# Patient Record
Sex: Male | Born: 1940
Health system: Southern US, Community
[De-identification: ages and names within clinical notes are randomized; demographics above are authoritative.]

## PROBLEM LIST (undated history)

## (undated) DIAGNOSIS — R29898 Other symptoms and signs involving the musculoskeletal system: Secondary | ICD-10-CM

## (undated) DIAGNOSIS — G459 Transient cerebral ischemic attack, unspecified: Secondary | ICD-10-CM

## (undated) DIAGNOSIS — G709 Myoneural disorder, unspecified: Secondary | ICD-10-CM

## (undated) DIAGNOSIS — I714 Abdominal aortic aneurysm, without rupture, unspecified: Secondary | ICD-10-CM

## (undated) DIAGNOSIS — I219 Acute myocardial infarction, unspecified: Secondary | ICD-10-CM

## (undated) DIAGNOSIS — E785 Hyperlipidemia, unspecified: Secondary | ICD-10-CM

## (undated) DIAGNOSIS — I10 Essential (primary) hypertension: Secondary | ICD-10-CM

## (undated) HISTORY — DX: Abdominal aortic aneurysm, without rupture: I71.4

## (undated) HISTORY — DX: Essential (primary) hypertension: I10

## (undated) HISTORY — DX: Abdominal aortic aneurysm, without rupture, unspecified: I71.40

## (undated) HISTORY — DX: Myoneural disorder, unspecified: G70.9

## (undated) HISTORY — DX: Hyperlipidemia, unspecified: E78.5

## (undated) HISTORY — DX: Acute myocardial infarction, unspecified: I21.9

## (undated) HISTORY — DX: Other symptoms and signs involving the musculoskeletal system: R29.898

## (undated) HISTORY — DX: Transient cerebral ischemic attack, unspecified: G45.9

---

## 2005-11-24 ENCOUNTER — Encounter: Admission: RE | Admit: 2005-11-24 | Discharge: 2005-11-24 | Payer: Self-pay | Admitting: Family Medicine

## 2006-08-20 ENCOUNTER — Encounter: Admission: RE | Admit: 2006-08-20 | Discharge: 2006-08-20 | Payer: Self-pay | Admitting: Emergency Medicine

## 2010-02-25 ENCOUNTER — Encounter: Admission: RE | Admit: 2010-02-25 | Discharge: 2010-02-25 | Payer: Self-pay | Admitting: Internal Medicine

## 2010-03-01 ENCOUNTER — Encounter: Admission: RE | Admit: 2010-03-01 | Discharge: 2010-03-01 | Payer: Self-pay | Admitting: Internal Medicine

## 2010-04-27 HISTORY — PX: CERVICAL FUSION: SHX112

## 2010-05-18 ENCOUNTER — Ambulatory Visit: Payer: Self-pay | Admitting: Internal Medicine

## 2010-05-18 ENCOUNTER — Ambulatory Visit: Payer: Self-pay | Admitting: Pulmonary Disease

## 2010-05-18 ENCOUNTER — Inpatient Hospital Stay (HOSPITAL_COMMUNITY): Admission: RE | Admit: 2010-05-18 | Discharge: 2010-05-26 | Payer: Self-pay | Admitting: Neurological Surgery

## 2010-05-19 ENCOUNTER — Encounter: Payer: Self-pay | Admitting: Pulmonary Disease

## 2010-05-24 ENCOUNTER — Ambulatory Visit: Payer: Self-pay | Admitting: Physical Medicine & Rehabilitation

## 2010-05-26 ENCOUNTER — Inpatient Hospital Stay (HOSPITAL_COMMUNITY)
Admission: RE | Admit: 2010-05-26 | Discharge: 2010-06-24 | Payer: Self-pay | Admitting: Physical Medicine & Rehabilitation

## 2010-05-28 ENCOUNTER — Ambulatory Visit: Payer: Self-pay | Admitting: Physical Medicine & Rehabilitation

## 2010-06-13 ENCOUNTER — Ambulatory Visit: Payer: Self-pay | Admitting: Physical Medicine & Rehabilitation

## 2010-06-24 ENCOUNTER — Encounter: Payer: Self-pay | Admitting: Internal Medicine

## 2010-07-05 ENCOUNTER — Telehealth: Payer: Self-pay | Admitting: Internal Medicine

## 2010-07-06 ENCOUNTER — Inpatient Hospital Stay (HOSPITAL_COMMUNITY): Admission: EM | Admit: 2010-07-06 | Discharge: 2010-07-12 | Payer: Self-pay | Admitting: Emergency Medicine

## 2010-07-18 ENCOUNTER — Inpatient Hospital Stay (HOSPITAL_COMMUNITY): Admission: EM | Admit: 2010-07-18 | Discharge: 2010-07-25 | Payer: Self-pay | Admitting: Emergency Medicine

## 2010-07-19 ENCOUNTER — Encounter (INDEPENDENT_AMBULATORY_CARE_PROVIDER_SITE_OTHER): Payer: Self-pay | Admitting: Internal Medicine

## 2010-07-22 ENCOUNTER — Ambulatory Visit: Payer: Self-pay | Admitting: Gastroenterology

## 2010-07-27 ENCOUNTER — Encounter (INDEPENDENT_AMBULATORY_CARE_PROVIDER_SITE_OTHER): Payer: Self-pay | Admitting: *Deleted

## 2010-08-29 IMAGING — CT CT ABD-PELV W/ CM
2 of 5 series · 13 of 32 positions shown, 18 images · IV contrast (water/omni  & 100ml omni 300)
Comparison: 08/20/2006

CLINICAL DATA: Abdominal and pelvic pain.  Nausea vomiting.

CT ABDOMEN AND PELVIS WITH CONTRAST
TECHNIQUE: Multidetector CT imaging of the abdomen and pelvis was
performed following the standard protocol during bolus
administration of intravenous contrast.
Contrast: 100 ml intravenous Fmnipaque-ALL

[Series 2: routine abdomen · axial · 0.72mm/px · z∈[-414,-94]mm · 5 of 97 slices shown, 10 images]
[im 17/97  soft-tissue]
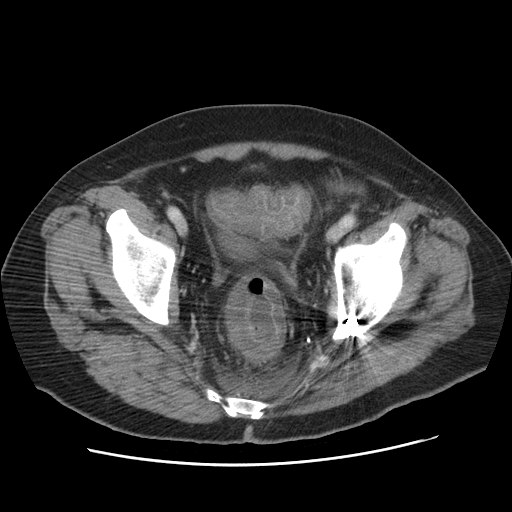
[im 17/97  bone]
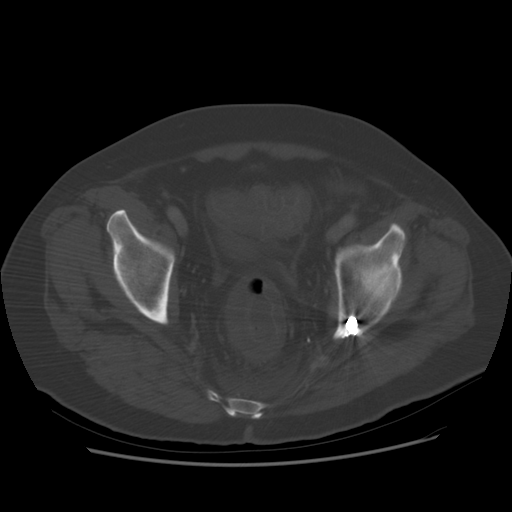
[im 33/97  soft-tissue]
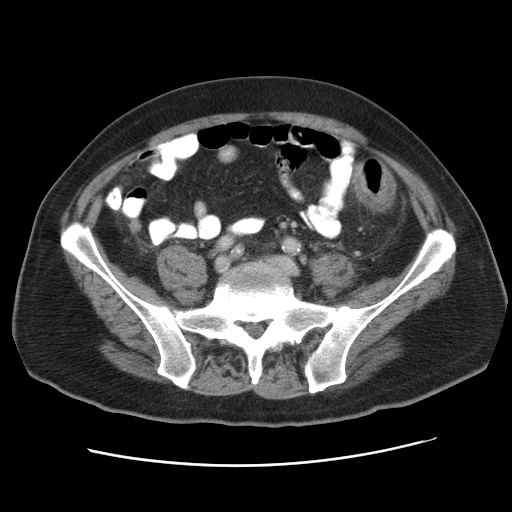
[im 33/97  lung]
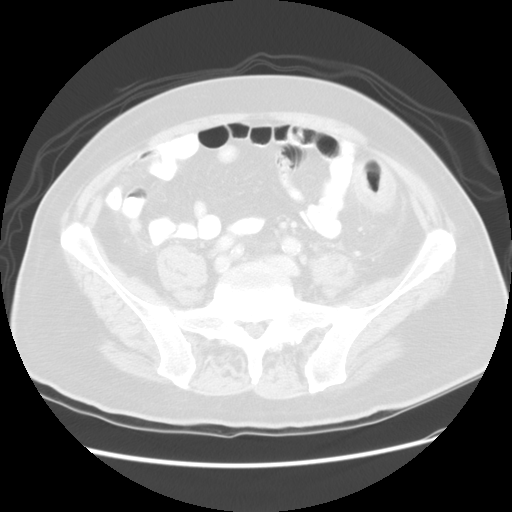
[im 49/97  soft-tissue]
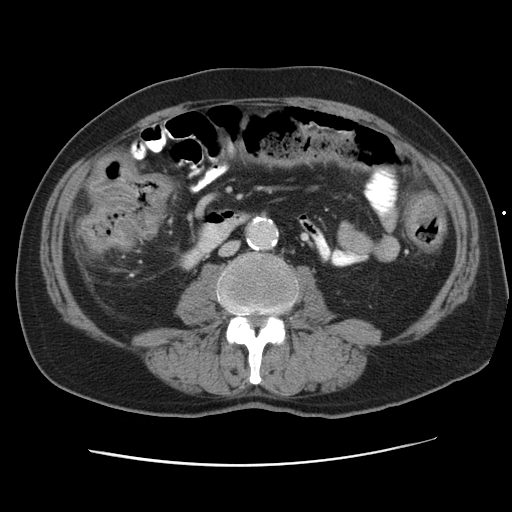
[im 49/97  lung]
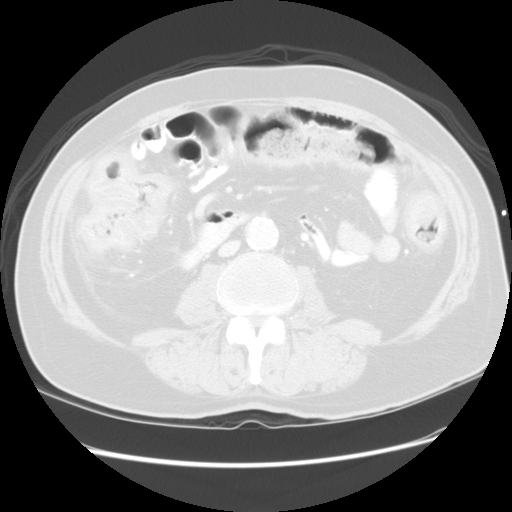
[im 65/97  soft-tissue]
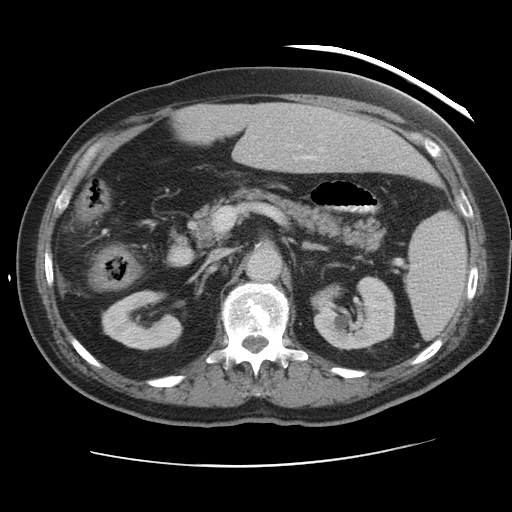
[im 65/97  lung]
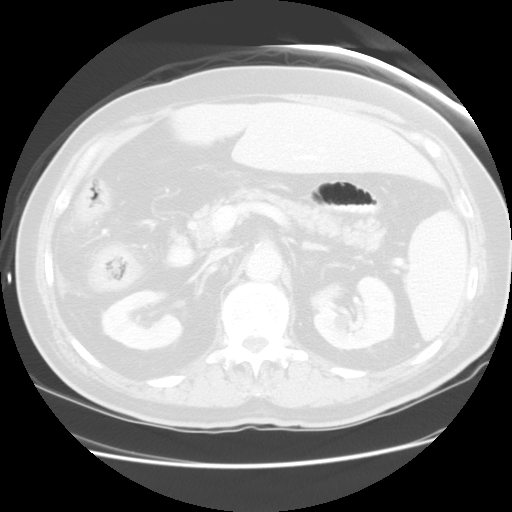
[im 81/97  soft-tissue]
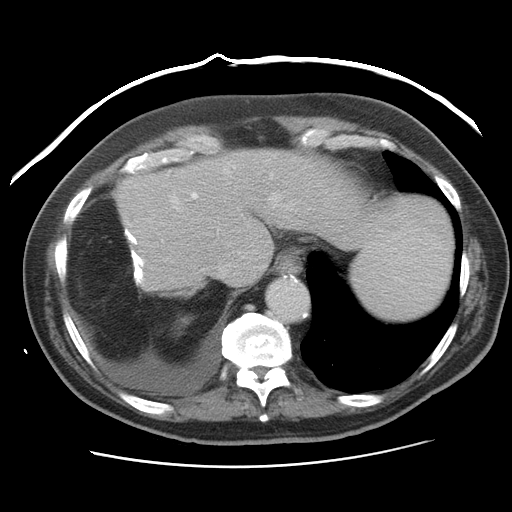
[im 81/97  lung]
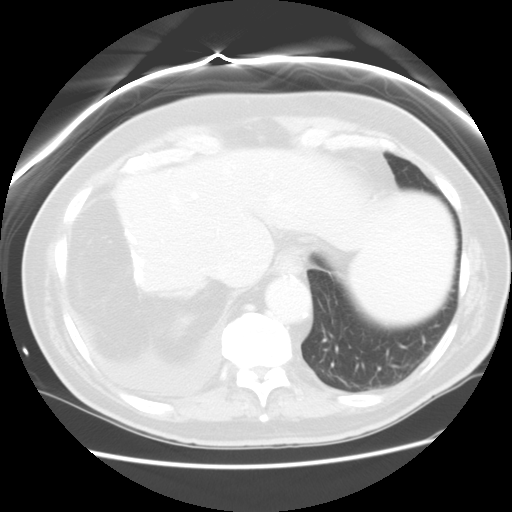

[Series 401: reformatted · sagittal · 0.90mm/px · 8 of 134 slices shown]
[im 14/134  soft-tissue]
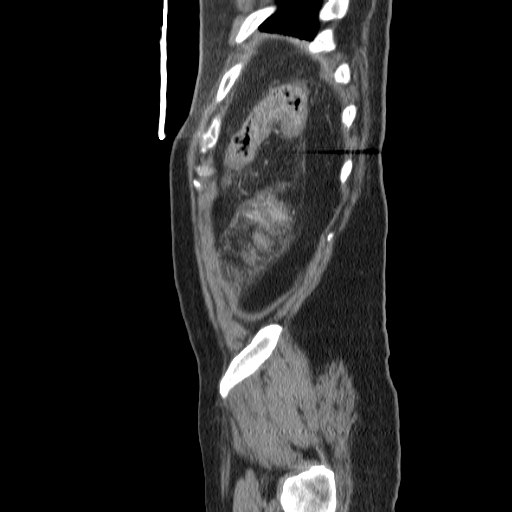
[im 27/134  soft-tissue]
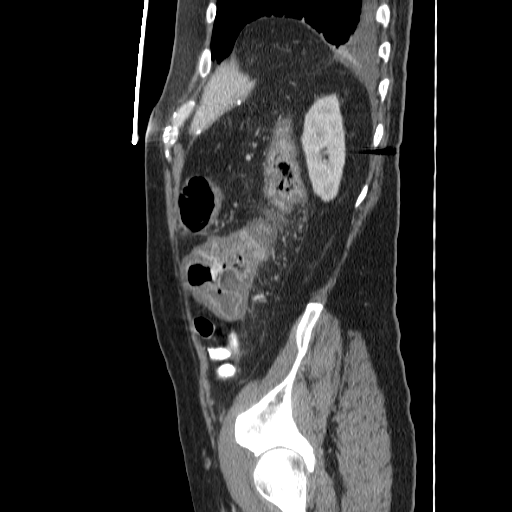
[im 40/134  soft-tissue]
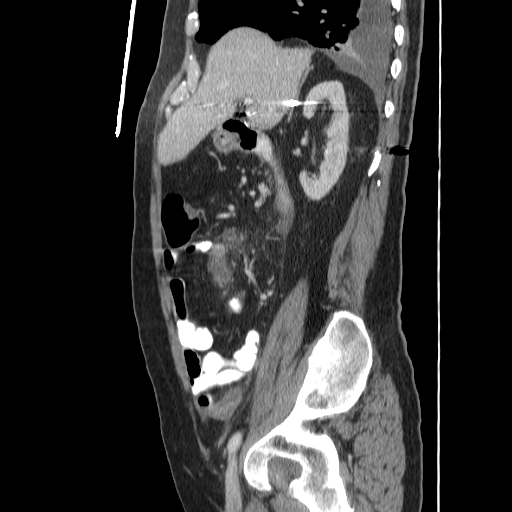
[im 54/134  soft-tissue]
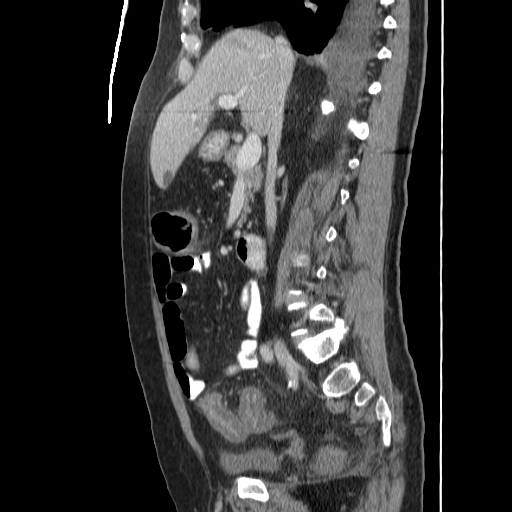
[im 80/134  soft-tissue]
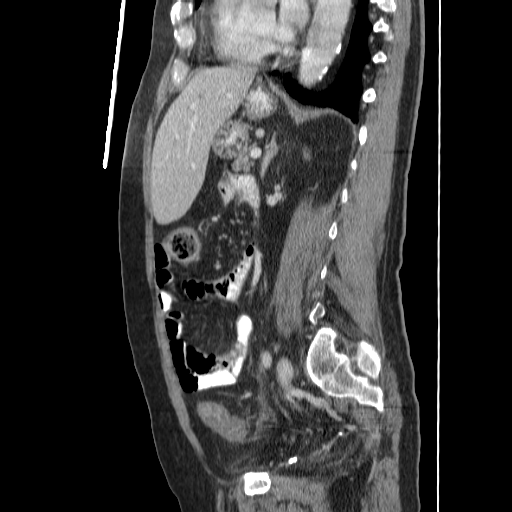
[im 94/134  soft-tissue]
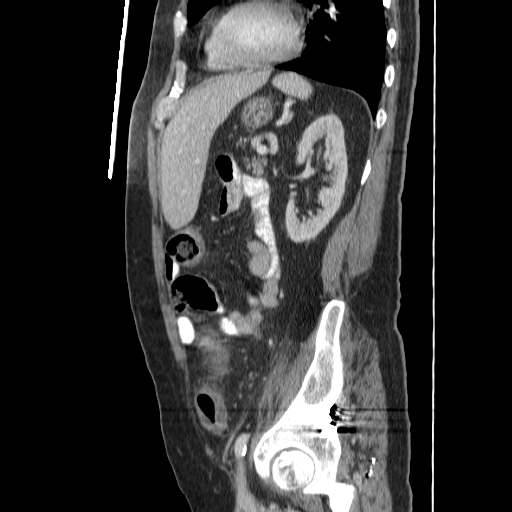
[im 107/134  soft-tissue]
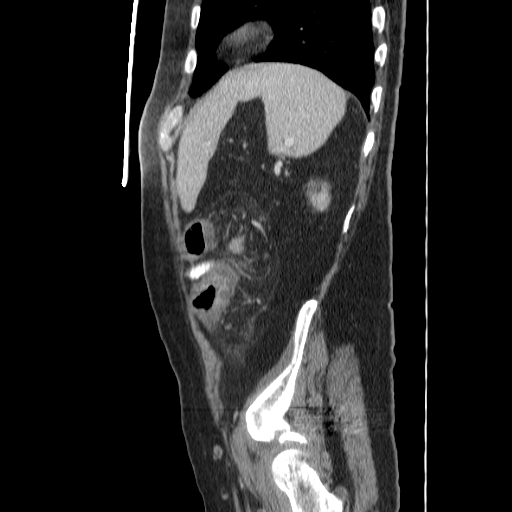
[im 120/134  soft-tissue]
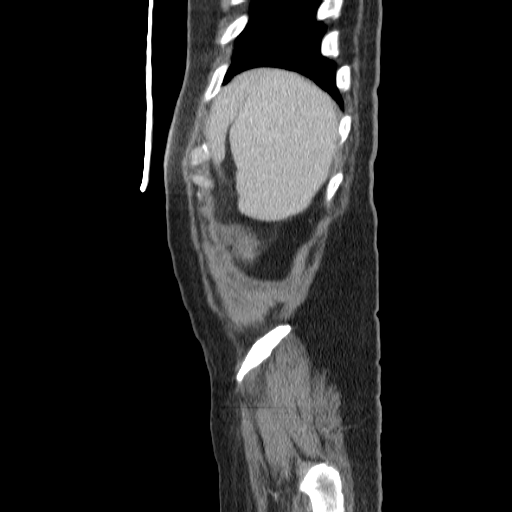

[13 of 32 positions shown; findings below may reference images not displayed]

FINDINGS: A small right pleural effusion is now identified.

Postoperative changes in the right upper abdomen are again noted.
Small hepatic cysts are again identified.
The spleen, adrenal glands, and pancreas are unremarkable.
Mild bilateral renal cortical atrophy is again noted.

There is diffuse wall thickening of the entire colon and rectum
with adjacent inflammation compatible with colitis.
There is no evidence of bowel obstruction, or pneumoperitoneum or
focal abscess.

Small amount of free fluid in the pelvis is noted.

There is no evidence of biliary dilatation or enlarged lymph nodes.
Ectasia of the abdominal aorta and a 1.4 cm left external iliac
artery aneurysm are stable.
The celiac artery, SMA, IMA and SMV are patent.

The bladder is within normal limits.
No acute or suspicious bony abnormalities are present.
IMPRESSION: Pancolitis - probably inflammatory or infectious.  No evidence of
pneumoperitoneum, bowel obstruction or focal abscess.

Small right pleural effusion and small amount of free pelvic fluid.

## 2010-09-20 ENCOUNTER — Encounter: Admission: RE | Admit: 2010-09-20 | Discharge: 2010-09-20 | Payer: Self-pay | Admitting: Neurological Surgery

## 2010-11-09 ENCOUNTER — Encounter
Admission: RE | Admit: 2010-11-09 | Discharge: 2010-11-24 | Payer: Self-pay | Source: Home / Self Care | Attending: Nurse Practitioner | Admitting: Nurse Practitioner

## 2010-11-24 ENCOUNTER — Encounter
Admission: RE | Admit: 2010-11-24 | Discharge: 2010-12-13 | Payer: Self-pay | Source: Home / Self Care | Attending: Nurse Practitioner | Admitting: Nurse Practitioner

## 2010-12-27 NOTE — Letter (Signed)
Summary: [facility]   Imported By: Roderic Ovens 07/15/2010 15:11:36  _____________________________________________________________________  External Attachment:    Type:   Image     Comment:   External Document

## 2010-12-27 NOTE — Letter (Signed)
Summary: Appt Reminder 2  [facility]  [address] [place], [place] [address]   Phone: [phone]  Fax: [phone]        July 27, 2010 MRN: [id number]    [name] [address] [place], [place]  [address]    Dear [name],   You have a return appointment with Dr. Christella Hartigan on 09/02/10 at 2:45 pm.  Please remember to bring a complete list of the medicines you are taking, your insurance card and your co-pay.  If you have to cancel or reschedule this appointment, please call before 5:00 pm the evening before to avoid a cancellation fee.  If you have any questions or concerns, please call [phone].    Sincerely,    [name] CMA (AAMA)  Appended Document: Appt Reminder 2 letter mailed

## 2010-12-27 NOTE — Progress Notes (Signed)
Summary: pt having problems  Phone Note From Other Clinic   Caller: Patient Caller: Archie Patten harding [phone] Summary of Call: home health nurse tonya harding wanted to [place] know what's going on with this pt-he has dizziness with sitting to standing from lying down, heart rate 110, bp went from 130 to 82 after standing, wife stated he had siezure like symptoms over the weekend, chewing on his tongue and mumbling, refuses to go to er, or any dr appt, and wife won't take him since he is able to tell her he doesn't want to go, takes med then spits it back out-wife had also been giving pt his hospital meds as well as all of his other meds after he was dc-now only giving him hosptial meds-tonya see's him twice a week, saw today and then again on friday at 3p-wife thinks pt has a myoview this week but pt won't go-tonya available for any questions or if she needs to do anything before or dring her visit Initial call taken by: Glynda Jaeger,  July 05, 2010 4:37 PM  Follow-up for Phone Call        Per discharge summary from the hospital this pt should be scheduled for out-patient myoview and OV with Dr Graciela Husbands. I left a message on the pt's home number to clarify what medications the pt is taking.  Julieta Gutting, RN, BSN  July 05, 2010 5:30 PM  Pt admitted to [facility]. Follow-up by: Julieta Gutting, RN, BSN,  July 06, 2010 11:37 AM

## 2011-01-30 ENCOUNTER — Ambulatory Visit
Admission: RE | Admit: 2011-01-30 | Discharge: 2011-01-30 | Disposition: A | Discharge status: Home / Self Care | Payer: Medicare Other | Source: Ambulatory Visit | Attending: Neurological Surgery | Admitting: Neurological Surgery

## 2011-01-30 ENCOUNTER — Other Ambulatory Visit: Payer: Self-pay | Admitting: Neurological Surgery

## 2011-01-30 DIAGNOSIS — M542 Cervicalgia: Secondary | ICD-10-CM

## 2011-02-10 LAB — BASIC METABOLIC PANEL WITH GFR
BUN: 11 mg/dL (ref 6–23)
BUN: 7 mg/dL (ref 6–23)
CO2: 19 meq/L (ref 19–32)
CO2: 20 meq/L (ref 19–32)
Calcium: 7.9 mg/dL — ABNORMAL LOW (ref 8.4–10.5)
Calcium: 8.1 mg/dL — ABNORMAL LOW (ref 8.4–10.5)
Chloride: 105 meq/L (ref 96–112)
Chloride: 107 meq/L (ref 96–112)
Creatinine, Ser: 0.6 mg/dL (ref 0.4–1.5)
Creatinine, Ser: 0.7 mg/dL (ref 0.4–1.5)
GFR calc non Af Amer: >60 mL/min
GFR calc non Af Amer: >60 mL/min
Glucose, Bld: 112 mg/dL — ABNORMAL HIGH (ref 70–99)
Glucose, Bld: 138 mg/dL — ABNORMAL HIGH (ref 70–99)
Potassium: 4.3 meq/L (ref 3.5–5.1)
Potassium: 4.7 meq/L (ref 3.5–5.1)
Sodium: 135 meq/L (ref 135–145)
Sodium: 135 meq/L (ref 135–145)

## 2011-02-10 LAB — BASIC METABOLIC PANEL
BUN: 11 mg/dL (ref 6–23)
BUN: 14 mg/dL (ref 6–23)
BUN: 15 mg/dL (ref 6–23)
BUN: 4 mg/dL — ABNORMAL LOW (ref 6–23)
BUN: 6 mg/dL (ref 6–23)
BUN: 14 mg/dL (ref 6–23)
CO2: 22 mEq/L (ref 19–32)
CO2: 19 mEq/L (ref 19–32)
CO2: 22 mEq/L (ref 19–32)
CO2: 22 mEq/L (ref 19–32)
CO2: 25 mEq/L (ref 19–32)
CO2: 19 mEq/L (ref 19–32)
Calcium: 7.4 mg/dL — ABNORMAL LOW (ref 8.4–10.5)
Calcium: 7.6 mg/dL — ABNORMAL LOW (ref 8.4–10.5)
Calcium: 7.7 mg/dL — ABNORMAL LOW (ref 8.4–10.5)
Calcium: 7.8 mg/dL — ABNORMAL LOW (ref 8.4–10.5)
Chloride: 105 mEq/L (ref 96–112)
Chloride: 106 mEq/L (ref 96–112)
Chloride: 107 mEq/L (ref 96–112)
Chloride: 109 mEq/L (ref 96–112)
Creatinine, Ser: 0.62 mg/dL (ref 0.4–1.5)
Creatinine, Ser: 0.47 mg/dL (ref 0.4–1.5)
Creatinine, Ser: 0.53 mg/dL (ref 0.4–1.5)
Creatinine, Ser: 0.56 mg/dL (ref 0.4–1.5)
Creatinine, Ser: 0.57 mg/dL (ref 0.4–1.5)
GFR calc Af Amer: >60 mL/min (ref >60)
GFR calc Af Amer: >60 mL/min (ref >60)
GFR calc non Af Amer: >60 mL/min (ref >60)
GFR calc non Af Amer: >60 mL/min (ref >60)
GFR calc non Af Amer: >60 mL/min (ref >60)
GFR calc non Af Amer: >60 mL/min (ref >60)
Glucose, Bld: 115 mg/dL — ABNORMAL HIGH (ref 70–99)
Glucose, Bld: 120 mg/dL — ABNORMAL HIGH (ref 70–99)
Glucose, Bld: 136 mg/dL — ABNORMAL HIGH (ref 70–99)
Glucose, Bld: 139 mg/dL — ABNORMAL HIGH (ref 70–99)
Glucose, Bld: 133 mg/dL — ABNORMAL HIGH (ref 70–99)
Potassium: 3.1 mEq/L — ABNORMAL LOW (ref 3.5–5.1)
Potassium: 3.1 mEq/L — ABNORMAL LOW (ref 3.5–5.1)
Sodium: 133 mEq/L — ABNORMAL LOW (ref 135–145)
Sodium: 135 mEq/L (ref 135–145)
Sodium: 132 mEq/L — ABNORMAL LOW (ref 135–145)

## 2011-02-10 LAB — CBC
HCT: 36.5 % — ABNORMAL LOW (ref 39.0–52.0)
HCT: 32.5 % — ABNORMAL LOW (ref 39.0–52.0)
HCT: 34.5 % — ABNORMAL LOW (ref 39.0–52.0)
HCT: 33.5 % — ABNORMAL LOW (ref 39.0–52.0)
HCT: 39.3 % (ref 39.0–52.0)
Hemoglobin: 12.3 g/dL — ABNORMAL LOW (ref 13.0–17.0)
Hemoglobin: 10.2 g/dL — ABNORMAL LOW (ref 13.0–17.0)
Hemoglobin: 11.8 g/dL — ABNORMAL LOW (ref 13.0–17.0)
Hemoglobin: 11 g/dL — ABNORMAL LOW (ref 13.0–17.0)
Hemoglobin: 12.4 g/dL — ABNORMAL LOW (ref 13.0–17.0)
Hemoglobin: 12.7 g/dL — ABNORMAL LOW (ref 13.0–17.0)
MCH: 30.2 pg (ref 26.0–34.0)
MCH: 30.3 pg (ref 26.0–34.0)
MCH: 30.3 pg (ref 26.0–34.0)
MCH: 31.2 pg (ref 26.0–34.0)
MCH: 29.7 pg (ref 26.0–34.0)
MCH: 29.7 pg (ref 26.0–34.0)
MCH: 30.3 pg (ref 26.0–34.0)
MCH: 30.8 pg (ref 26.0–34.0)
MCHC: 33.5 g/dL (ref 30.0–36.0)
MCHC: 33.7 g/dL (ref 30.0–36.0)
MCHC: 36.4 g/dL — ABNORMAL HIGH (ref 30.0–36.0)
MCHC: 32.3 g/dL (ref 30.0–36.0)
MCHC: 32.8 g/dL (ref 30.0–36.0)
MCV: 88.2 fL (ref 78.0–100.0)
MCV: 85.7 fL (ref 78.0–100.0)
MCV: 89 fL (ref 78.0–100.0)
MCV: 90.4 fL (ref 78.0–100.0)
MCV: 91 fL (ref 78.0–100.0)
Platelets: 253 10*3/uL (ref 150–400)
Platelets: 253 10*3/uL (ref 150–400)
Platelets: 226 10*3/uL (ref 150–400)
Platelets: 238 10*3/uL (ref 150–400)
RBC: 3.93 MIL/uL — ABNORMAL LOW (ref 4.22–5.81)
RBC: 4.09 MIL/uL — ABNORMAL LOW (ref 4.22–5.81)
RBC: 4.27 MIL/uL (ref 4.22–5.81)
RDW: 13.3 % (ref 11.5–15.5)
RDW: 14.2 % (ref 11.5–15.5)
RDW: 14.4 % (ref 11.5–15.5)
RDW: 14.6 % (ref 11.5–15.5)
RDW: 13.2 % (ref 11.5–15.5)
RDW: 13.4 % (ref 11.5–15.5)
WBC: 12.1 10*3/uL — ABNORMAL HIGH (ref 4.0–10.5)
WBC: 15.3 10*3/uL — ABNORMAL HIGH (ref 4.0–10.5)
WBC: 14.3 10*3/uL — ABNORMAL HIGH (ref 4.0–10.5)

## 2011-02-10 LAB — URINE CULTURE: Culture  Setup Time: 201108232151

## 2011-02-10 LAB — URINE MICROSCOPIC-ADD ON

## 2011-02-10 LAB — COMPREHENSIVE METABOLIC PANEL
AST: 21 U/L (ref 0–37)
Albumin: 2.7 g/dL — ABNORMAL LOW (ref 3.5–5.2)
BUN: 14 mg/dL (ref 6–23)
Calcium: 7.3 mg/dL — ABNORMAL LOW (ref 8.4–10.5)
Calcium: 8.9 mg/dL (ref 8.4–10.5)
Chloride: 101 mEq/L (ref 96–112)
Creatinine, Ser: 0.6 mg/dL (ref 0.4–1.5)
Creatinine, Ser: 0.94 mg/dL (ref 0.4–1.5)
GFR calc Af Amer: >60 mL/min (ref >60)
GFR calc non Af Amer: >60 mL/min (ref >60)
Glucose, Bld: 151 mg/dL — ABNORMAL HIGH (ref 70–99)
Sodium: 129 mEq/L — ABNORMAL LOW (ref 135–145)
Total Bilirubin: 0.8 mg/dL (ref 0.3–1.2)
Total Protein: 4.8 g/dL — ABNORMAL LOW (ref 6.0–8.3)
Total Protein: 6 g/dL (ref 6.0–8.3)

## 2011-02-10 LAB — URINALYSIS, ROUTINE W REFLEX MICROSCOPIC
Bilirubin Urine: NEGATIVE
Glucose, UA: NEGATIVE mg/dL
Ketones, ur: 40 mg/dL — AB
Ketones, ur: 40 mg/dL — AB
Leukocytes, UA: NEGATIVE
Nitrite: POSITIVE — AB
Specific Gravity, Urine: 1.02 (ref 1.005–1.030)
Specific Gravity, Urine: 1.025 (ref 1.005–1.030)
Urobilinogen, UA: 0.2 mg/dL (ref 0.0–1.0)
pH: 5 (ref 5.0–8.0)
pH: 5.5 (ref 5.0–8.0)

## 2011-02-10 LAB — CLOSTRIDIUM DIFFICILE EIA
C difficile Toxins A+B, EIA: 23
C difficile Toxins A+B, EIA: NEGATIVE
C difficile Toxins A+B, EIA: NEGATIVE

## 2011-02-10 LAB — POCT I-STAT, CHEM 8
BUN: 13 mg/dL (ref 6–23)
Chloride: 99 mEq/L (ref 96–112)
HCT: 34 % — ABNORMAL LOW (ref 39.0–52.0)
Sodium: 130 mEq/L — ABNORMAL LOW (ref 135–145)

## 2011-02-10 LAB — STOOL CULTURE

## 2011-02-10 LAB — CK TOTAL AND CKMB (NOT AT ARMC)
CK, MB: 2.4 ng/mL (ref 0.3–4.0)
Total CK: 36 U/L (ref 7–232)

## 2011-02-10 LAB — PATHOLOGIST SMEAR REVIEW

## 2011-02-10 LAB — OVA AND PARASITE EXAMINATION

## 2011-02-10 LAB — PROTEIN, BODY FLUID

## 2011-02-10 LAB — FECAL LACTOFERRIN, QUANT: Fecal Lactoferrin: POSITIVE

## 2011-02-10 LAB — DIFFERENTIAL
Basophils Absolute: 0.1 10*3/uL (ref 0.0–0.1)
Eosinophils Relative: 0 % (ref 0–5)
Lymphocytes Relative: 3 % — ABNORMAL LOW (ref 12–46)
Lymphocytes Relative: 6 % — ABNORMAL LOW (ref 12–46)
Lymphs Abs: 0.6 10*3/uL — ABNORMAL LOW (ref 0.7–4.0)
Lymphs Abs: 1.1 10*3/uL (ref 0.7–4.0)
Monocytes Absolute: 1.4 10*3/uL — ABNORMAL HIGH (ref 0.1–1.0)
Monocytes Relative: 7 % (ref 3–12)
Neutro Abs: 20.8 10*3/uL — ABNORMAL HIGH (ref 1.7–7.7)
Neutrophils Relative %: 93 % — ABNORMAL HIGH (ref 43–77)

## 2011-02-10 LAB — LACTATE DEHYDROGENASE, PLEURAL OR PERITONEAL FLUID: LD, Fluid: 57 U/L — ABNORMAL HIGH (ref 3–23)

## 2011-02-10 LAB — BODY FLUID CELL COUNT WITH DIFFERENTIAL: Monocyte-Macrophage-Serous Fluid: 16 % — ABNORMAL LOW (ref 50–90)

## 2011-02-10 LAB — BODY FLUID CULTURE

## 2011-02-10 LAB — LACTIC ACID, PLASMA
Lactic Acid, Venous: 1.3 mmol/L (ref 0.5–2.2)
Lactic Acid, Venous: 0.9 mmol/L (ref 0.5–2.2)

## 2011-02-10 LAB — MAGNESIUM: Magnesium: 1.8 mg/dL (ref 1.5–2.5)

## 2011-02-10 LAB — TROPONIN I

## 2011-02-10 LAB — LACTATE DEHYDROGENASE: LDH: 176 U/L (ref 94–250)

## 2011-02-11 LAB — URINALYSIS, MICROSCOPIC ONLY
Glucose, UA: NEGATIVE mg/dL
Nitrite: POSITIVE — AB
Protein, ur: 30 mg/dL — AB
Specific Gravity, Urine: 1.028 (ref 1.005–1.030)
Urobilinogen, UA: 0.2 mg/dL (ref 0.0–1.0)
pH: 5 (ref 5.0–8.0)

## 2011-02-11 LAB — CBC
HCT: 30 % — ABNORMAL LOW (ref 39.0–52.0)
HCT: 32.1 % — ABNORMAL LOW (ref 39.0–52.0)
Hemoglobin: 10.3 g/dL — ABNORMAL LOW (ref 13.0–17.0)
Hemoglobin: 10.4 g/dL — ABNORMAL LOW (ref 13.0–17.0)
Hemoglobin: 11.1 g/dL — ABNORMAL LOW (ref 13.0–17.0)
MCH: 31.6 pg (ref 26.0–34.0)
MCH: 32 pg (ref 26.0–34.0)
MCHC: 34.6 g/dL (ref 30.0–36.0)
MCHC: 34.7 g/dL (ref 30.0–36.0)
MCV: 93.3 fL (ref 78.0–100.0)
Platelets: 161 10*3/uL (ref 150–400)
RBC: 3.25 MIL/uL — ABNORMAL LOW (ref 4.22–5.81)
RBC: 3.46 MIL/uL — ABNORMAL LOW (ref 4.22–5.81)
RDW: 12.9 % (ref 11.5–15.5)

## 2011-02-11 LAB — BASIC METABOLIC PANEL
BUN: 18 mg/dL (ref 6–23)
CO2: 26 mEq/L (ref 19–32)
Calcium: 8.9 mg/dL (ref 8.4–10.5)
Chloride: 99 mEq/L (ref 96–112)
Creatinine, Ser: 0.72 mg/dL (ref 0.4–1.5)
GFR calc Af Amer: 59 mL/min — ABNORMAL LOW (ref >60)
GFR calc non Af Amer: >60 mL/min (ref >60)
Glucose, Bld: 133 mg/dL — ABNORMAL HIGH (ref 70–99)
Potassium: 4.3 mEq/L (ref 3.5–5.1)
Sodium: 134 mEq/L — ABNORMAL LOW (ref 135–145)

## 2011-02-11 LAB — URINE CULTURE: Culture: NO GROWTH

## 2011-02-11 LAB — DIFFERENTIAL
Basophils Absolute: 0 10*3/uL (ref 0.0–0.1)
Basophils Relative: 0 % (ref 0–1)
Monocytes Absolute: 0.6 10*3/uL (ref 0.1–1.0)
Neutro Abs: 5.6 10*3/uL (ref 1.7–7.7)

## 2011-02-11 LAB — CULTURE, BLOOD (ROUTINE X 2)
Culture: NO GROWTH
Culture: NO GROWTH

## 2011-02-12 LAB — BASIC METABOLIC PANEL
BUN: 26 mg/dL — ABNORMAL HIGH (ref 6–23)
BUN: 22 mg/dL (ref 6–23)
BUN: 36 mg/dL — ABNORMAL HIGH (ref 6–23)
BUN: 41 mg/dL — ABNORMAL HIGH (ref 6–23)
BUN: 41 mg/dL — ABNORMAL HIGH (ref 6–23)
BUN: 49 mg/dL — ABNORMAL HIGH (ref 6–23)
BUN: 53 mg/dL — ABNORMAL HIGH (ref 6–23)
CO2: 15 mEq/L — ABNORMAL LOW (ref 19–32)
CO2: 22 mEq/L (ref 19–32)
CO2: 22 mEq/L (ref 19–32)
CO2: 24 mEq/L (ref 19–32)
Calcium: 6.8 mg/dL — ABNORMAL LOW (ref 8.4–10.5)
Calcium: 6.9 mg/dL — ABNORMAL LOW (ref 8.4–10.5)
Calcium: 7.2 mg/dL — ABNORMAL LOW (ref 8.4–10.5)
Calcium: 8.4 mg/dL (ref 8.4–10.5)
Chloride: 104 mEq/L (ref 96–112)
Chloride: 101 mEq/L (ref 96–112)
Chloride: 108 mEq/L (ref 96–112)
Chloride: 98 mEq/L (ref 96–112)
Creatinine, Ser: 1.18 mg/dL (ref 0.4–1.5)
Creatinine, Ser: 1.54 mg/dL — ABNORMAL HIGH (ref 0.4–1.5)
Creatinine, Ser: 2.23 mg/dL — ABNORMAL HIGH (ref 0.4–1.5)
GFR calc non Af Amer: 23 mL/min — ABNORMAL LOW (ref >60)
GFR calc non Af Amer: 31 mL/min — ABNORMAL LOW (ref >60)
GFR calc non Af Amer: 33 mL/min — ABNORMAL LOW (ref >60)
GFR calc non Af Amer: >60 mL/min (ref >60)
Glucose, Bld: 174 mg/dL — ABNORMAL HIGH (ref 70–99)
Glucose, Bld: 211 mg/dL — ABNORMAL HIGH (ref 70–99)
Glucose, Bld: 234 mg/dL — ABNORMAL HIGH (ref 70–99)
Glucose, Bld: 242 mg/dL — ABNORMAL HIGH (ref 70–99)
Glucose, Bld: 345 mg/dL — ABNORMAL HIGH (ref 70–99)
Glucose, Bld: 541 mg/dL — ABNORMAL HIGH (ref 70–99)
Potassium: 4.2 mEq/L (ref 3.5–5.1)
Potassium: 3.9 mEq/L (ref 3.5–5.1)
Potassium: 4.3 mEq/L (ref 3.5–5.1)
Potassium: 4.3 mEq/L (ref 3.5–5.1)
Potassium: 4.5 mEq/L (ref 3.5–5.1)
Sodium: 131 mEq/L — ABNORMAL LOW (ref 135–145)

## 2011-02-12 LAB — PROTIME-INR
INR: 1.01 (ref 0.00–1.49)
Prothrombin Time: 13.2 seconds (ref 11.6–15.2)

## 2011-02-12 LAB — CARDIAC PANEL(CRET KIN+CKTOT+MB+TROPI)
CK, MB: 17.8 ng/mL (ref 0.3–4.0)
Relative Index: 5.1 — ABNORMAL HIGH (ref 0.0–2.5)
Relative Index: 5.6 — ABNORMAL HIGH (ref 0.0–2.5)
Relative Index: 7.4 — ABNORMAL HIGH (ref 0.0–2.5)
Total CK: 280 U/L — ABNORMAL HIGH (ref 7–232)
Troponin I: 0.02 ng/mL (ref 0.00–0.06)
Troponin I: 0.21 ng/mL — ABNORMAL HIGH (ref 0.00–0.06)
Troponin I: 0.29 ng/mL — ABNORMAL HIGH (ref 0.00–0.06)

## 2011-02-12 LAB — COMPREHENSIVE METABOLIC PANEL
Albumin: 2.6 g/dL — ABNORMAL LOW (ref 3.5–5.2)
Albumin: 4 g/dL (ref 3.5–5.2)
BUN: 28 mg/dL — ABNORMAL HIGH (ref 6–23)
BUN: 17 mg/dL (ref 6–23)
CO2: 23 mEq/L (ref 19–32)
CO2: 27 mEq/L (ref 19–32)
Chloride: 97 mEq/L (ref 96–112)
Chloride: 102 mEq/L (ref 96–112)
Creatinine, Ser: 1.1 mg/dL (ref 0.4–1.5)
Creatinine, Ser: 1.07 mg/dL (ref 0.4–1.5)
GFR calc non Af Amer: >60 mL/min (ref >60)
GFR calc non Af Amer: >60 mL/min (ref >60)
Glucose, Bld: 114 mg/dL — ABNORMAL HIGH (ref 70–99)
Total Bilirubin: 1 mg/dL (ref 0.3–1.2)
Total Bilirubin: 0.7 mg/dL (ref 0.3–1.2)

## 2011-02-12 LAB — CBC
HCT: 28.5 % — ABNORMAL LOW (ref 39.0–52.0)
HCT: 34.4 % — ABNORMAL LOW (ref 39.0–52.0)
HCT: 34.9 % — ABNORMAL LOW (ref 39.0–52.0)
HCT: 36.9 % — ABNORMAL LOW (ref 39.0–52.0)
HCT: 45.5 % (ref 39.0–52.0)
Hemoglobin: 9.9 g/dL — ABNORMAL LOW (ref 13.0–17.0)
MCH: 33 pg (ref 26.0–34.0)
MCH: 33 pg (ref 26.0–34.0)
MCHC: 34.9 g/dL (ref 30.0–36.0)
MCHC: 35 g/dL (ref 30.0–36.0)
MCHC: 35.6 g/dL (ref 30.0–36.0)
MCV: 93.8 fL (ref 78.0–100.0)
MCV: 93.6 fL (ref 78.0–100.0)
Platelets: 177 10*3/uL (ref 150–400)
Platelets: 116 10*3/uL — ABNORMAL LOW (ref 150–400)
Platelets: 155 10*3/uL (ref 150–400)
Platelets: 176 10*3/uL (ref 150–400)
RBC: 4.27 MIL/uL (ref 4.22–5.81)
RBC: 2.99 MIL/uL — ABNORMAL LOW (ref 4.22–5.81)
RDW: 12.7 % (ref 11.5–15.5)
RDW: 12.8 % (ref 11.5–15.5)
RDW: 12.9 % (ref 11.5–15.5)
WBC: 21.2 10*3/uL — ABNORMAL HIGH (ref 4.0–10.5)
WBC: 7.2 10*3/uL (ref 4.0–10.5)
WBC: 7.2 10*3/uL (ref 4.0–10.5)

## 2011-02-12 LAB — URINALYSIS, ROUTINE W REFLEX MICROSCOPIC
Bilirubin Urine: NEGATIVE
Ketones, ur: NEGATIVE mg/dL
Leukocytes, UA: NEGATIVE
Nitrite: NEGATIVE
Protein, ur: NEGATIVE mg/dL

## 2011-02-12 LAB — HEMOGLOBIN A1C
Hgb A1c MFr Bld: 5.8 % — ABNORMAL HIGH (ref <5.7)
Mean Plasma Glucose: 120 mg/dL — ABNORMAL HIGH (ref <117)
Mean Plasma Glucose: 146 mg/dL — ABNORMAL HIGH (ref <117)

## 2011-02-12 LAB — GLUCOSE, CAPILLARY
Glucose-Capillary: 128 mg/dL — ABNORMAL HIGH (ref 70–99)
Glucose-Capillary: 134 mg/dL — ABNORMAL HIGH (ref 70–99)
Glucose-Capillary: 144 mg/dL — ABNORMAL HIGH (ref 70–99)
Glucose-Capillary: 153 mg/dL — ABNORMAL HIGH (ref 70–99)
Glucose-Capillary: 158 mg/dL — ABNORMAL HIGH (ref 70–99)
Glucose-Capillary: 158 mg/dL — ABNORMAL HIGH (ref 70–99)
Glucose-Capillary: 115 mg/dL — ABNORMAL HIGH (ref 70–99)
Glucose-Capillary: 120 mg/dL — ABNORMAL HIGH (ref 70–99)
Glucose-Capillary: 132 mg/dL — ABNORMAL HIGH (ref 70–99)
Glucose-Capillary: 166 mg/dL — ABNORMAL HIGH (ref 70–99)
Glucose-Capillary: 177 mg/dL — ABNORMAL HIGH (ref 70–99)
Glucose-Capillary: 178 mg/dL — ABNORMAL HIGH (ref 70–99)
Glucose-Capillary: 193 mg/dL — ABNORMAL HIGH (ref 70–99)
Glucose-Capillary: 199 mg/dL — ABNORMAL HIGH (ref 70–99)
Glucose-Capillary: 201 mg/dL — ABNORMAL HIGH (ref 70–99)
Glucose-Capillary: 203 mg/dL — ABNORMAL HIGH (ref 70–99)
Glucose-Capillary: 206 mg/dL — ABNORMAL HIGH (ref 70–99)
Glucose-Capillary: 214 mg/dL — ABNORMAL HIGH (ref 70–99)
Glucose-Capillary: 217 mg/dL — ABNORMAL HIGH (ref 70–99)
Glucose-Capillary: 226 mg/dL — ABNORMAL HIGH (ref 70–99)
Glucose-Capillary: 238 mg/dL — ABNORMAL HIGH (ref 70–99)
Glucose-Capillary: 244 mg/dL — ABNORMAL HIGH (ref 70–99)
Glucose-Capillary: 246 mg/dL — ABNORMAL HIGH (ref 70–99)
Glucose-Capillary: 306 mg/dL — ABNORMAL HIGH (ref 70–99)
Glucose-Capillary: 420 mg/dL — ABNORMAL HIGH (ref 70–99)
Glucose-Capillary: 438 mg/dL — ABNORMAL HIGH (ref 70–99)
Glucose-Capillary: 453 mg/dL — ABNORMAL HIGH (ref 70–99)
Glucose-Capillary: 521 mg/dL — ABNORMAL HIGH (ref 70–99)

## 2011-02-12 LAB — SURGICAL PCR SCREEN: Staphylococcus aureus: NEGATIVE

## 2011-02-12 LAB — DIFFERENTIAL
Basophils Absolute: 0 10*3/uL (ref 0.0–0.1)
Basophils Absolute: 0 10*3/uL (ref 0.0–0.1)
Lymphocytes Relative: 9 % — ABNORMAL LOW (ref 12–46)
Lymphocytes Relative: 18 % (ref 12–46)
Neutro Abs: 10.4 10*3/uL — ABNORMAL HIGH (ref 1.7–7.7)
Neutro Abs: 5 10*3/uL (ref 1.7–7.7)
Neutrophils Relative %: 81 % — ABNORMAL HIGH (ref 43–77)
Neutrophils Relative %: 70 % (ref 43–77)

## 2011-02-12 LAB — URINE MICROSCOPIC-ADD ON

## 2011-02-12 LAB — CREATININE, URINE, RANDOM: Creatinine, Urine: 109.1 mg/dL

## 2011-02-12 LAB — APTT: aPTT: 27 seconds (ref 24–37)

## 2011-02-12 LAB — LACTIC ACID, PLASMA: Lactic Acid, Venous: 5.4 mmol/L — ABNORMAL HIGH (ref 0.5–2.2)

## 2011-02-12 LAB — URINE CULTURE

## 2012-04-05 DIAGNOSIS — I6789 Other cerebrovascular disease: Secondary | ICD-10-CM | POA: Diagnosis not present

## 2012-04-05 DIAGNOSIS — I10 Essential (primary) hypertension: Secondary | ICD-10-CM | POA: Diagnosis not present

## 2012-04-05 DIAGNOSIS — R350 Frequency of micturition: Secondary | ICD-10-CM | POA: Diagnosis not present

## 2012-04-08 DIAGNOSIS — R7989 Other specified abnormal findings of blood chemistry: Secondary | ICD-10-CM | POA: Diagnosis not present

## 2012-04-08 DIAGNOSIS — I6789 Other cerebrovascular disease: Secondary | ICD-10-CM | POA: Diagnosis not present

## 2012-04-08 DIAGNOSIS — E785 Hyperlipidemia, unspecified: Secondary | ICD-10-CM | POA: Diagnosis not present

## 2012-04-08 DIAGNOSIS — R350 Frequency of micturition: Secondary | ICD-10-CM | POA: Diagnosis not present

## 2012-04-08 DIAGNOSIS — Z125 Encounter for screening for malignant neoplasm of prostate: Secondary | ICD-10-CM | POA: Diagnosis not present

## 2012-04-08 DIAGNOSIS — G459 Transient cerebral ischemic attack, unspecified: Secondary | ICD-10-CM | POA: Diagnosis not present

## 2012-04-08 DIAGNOSIS — R361 Hematospermia: Secondary | ICD-10-CM | POA: Diagnosis not present

## 2012-04-11 ENCOUNTER — Other Ambulatory Visit: Payer: Self-pay | Admitting: Family Medicine

## 2012-04-11 DIAGNOSIS — R202 Paresthesia of skin: Secondary | ICD-10-CM

## 2012-04-17 ENCOUNTER — Other Ambulatory Visit: Payer: Medicare Other

## 2012-05-06 ENCOUNTER — Ambulatory Visit
Admission: RE | Admit: 2012-05-06 | Discharge: 2012-05-06 | Disposition: A | Discharge status: Home / Self Care | Payer: Medicare Other | Source: Ambulatory Visit | Attending: Family Medicine | Admitting: Family Medicine

## 2012-05-06 DIAGNOSIS — R202 Paresthesia of skin: Secondary | ICD-10-CM

## 2012-05-06 DIAGNOSIS — R209 Unspecified disturbances of skin sensation: Secondary | ICD-10-CM | POA: Diagnosis not present

## 2012-05-06 DIAGNOSIS — Z8673 Personal history of transient ischemic attack (TIA), and cerebral infarction without residual deficits: Secondary | ICD-10-CM | POA: Diagnosis not present

## 2012-05-06 DIAGNOSIS — I6529 Occlusion and stenosis of unspecified carotid artery: Secondary | ICD-10-CM | POA: Diagnosis not present

## 2012-05-06 DIAGNOSIS — I6350 Cerebral infarction due to unspecified occlusion or stenosis of unspecified cerebral artery: Secondary | ICD-10-CM | POA: Diagnosis not present

## 2012-05-08 DIAGNOSIS — E785 Hyperlipidemia, unspecified: Secondary | ICD-10-CM | POA: Diagnosis not present

## 2012-05-08 DIAGNOSIS — I10 Essential (primary) hypertension: Secondary | ICD-10-CM | POA: Diagnosis not present

## 2012-06-20 DIAGNOSIS — N401 Enlarged prostate with lower urinary tract symptoms: Secondary | ICD-10-CM | POA: Diagnosis not present

## 2012-06-20 DIAGNOSIS — N390 Urinary tract infection, site not specified: Secondary | ICD-10-CM | POA: Diagnosis not present

## 2012-06-20 DIAGNOSIS — N281 Cyst of kidney, acquired: Secondary | ICD-10-CM | POA: Diagnosis not present

## 2012-11-28 DIAGNOSIS — Z23 Encounter for immunization: Secondary | ICD-10-CM | POA: Diagnosis not present

## 2012-11-29 DIAGNOSIS — I10 Essential (primary) hypertension: Secondary | ICD-10-CM | POA: Diagnosis not present

## 2012-11-29 DIAGNOSIS — E785 Hyperlipidemia, unspecified: Secondary | ICD-10-CM | POA: Diagnosis not present

## 2012-12-02 DIAGNOSIS — R7989 Other specified abnormal findings of blood chemistry: Secondary | ICD-10-CM | POA: Diagnosis not present

## 2012-12-23 ENCOUNTER — Other Ambulatory Visit: Payer: Self-pay | Admitting: Family Medicine

## 2012-12-23 ENCOUNTER — Ambulatory Visit
Admission: RE | Admit: 2012-12-23 | Discharge: 2012-12-23 | Disposition: A | Discharge status: Home / Self Care | Payer: Medicare Other | Source: Ambulatory Visit | Attending: Family Medicine | Admitting: Family Medicine

## 2012-12-23 DIAGNOSIS — M545 Low back pain: Secondary | ICD-10-CM | POA: Diagnosis not present

## 2012-12-23 DIAGNOSIS — M5137 Other intervertebral disc degeneration, lumbosacral region: Secondary | ICD-10-CM | POA: Diagnosis not present

## 2012-12-23 DIAGNOSIS — M47817 Spondylosis without myelopathy or radiculopathy, lumbosacral region: Secondary | ICD-10-CM | POA: Diagnosis not present

## 2013-01-16 ENCOUNTER — Encounter: Payer: Self-pay | Admitting: *Deleted

## 2013-01-16 DIAGNOSIS — I10 Essential (primary) hypertension: Secondary | ICD-10-CM | POA: Insufficient documentation

## 2013-01-16 DIAGNOSIS — R29898 Other symptoms and signs involving the musculoskeletal system: Secondary | ICD-10-CM | POA: Insufficient documentation

## 2013-02-13 ENCOUNTER — Telehealth: Payer: Self-pay | Admitting: Family Medicine

## 2013-02-13 MED ORDER — PREGABALIN 75 MG PO CAPS: 75.0000 mg | ORAL_CAPSULE | Freq: Two times a day (BID) | ORAL | Status: AC

## 2013-02-13 NOTE — Telephone Encounter (Signed)
I have filled out phone in RX for 6 months.

## 2013-02-13 NOTE — Telephone Encounter (Signed)
Wife called told Rx has been done for .  Call if any further problems

## 2013-03-06 ENCOUNTER — Telehealth: Payer: Self-pay | Admitting: Family Medicine

## 2013-03-07 ENCOUNTER — Telehealth: Payer: Self-pay | Admitting: Family Medicine

## 2013-03-07 MED ORDER — MELOXICAM 15 MG PO TABS: 15.0000 mg | ORAL_TABLET | Freq: Every day | ORAL | Status: DC

## 2013-03-07 NOTE — Telephone Encounter (Signed)
Rx Refilled  

## 2013-03-09 DIAGNOSIS — H1030 Unspecified acute conjunctivitis, unspecified eye: Secondary | ICD-10-CM | POA: Diagnosis not present

## 2013-04-09 ENCOUNTER — Other Ambulatory Visit: Payer: Self-pay | Admitting: Family Medicine

## 2013-04-09 MED ORDER — METOPROLOL TARTRATE 50 MG PO TABS: 50.0000 mg | ORAL_TABLET | Freq: Two times a day (BID) | ORAL | Status: DC

## 2013-04-09 NOTE — Telephone Encounter (Signed)
Med refilled.

## 2013-05-15 ENCOUNTER — Telehealth: Payer: Self-pay | Admitting: Family Medicine

## 2013-05-15 MED ORDER — AMLODIPINE BESYLATE 10 MG PO TABS: 10.0000 mg | ORAL_TABLET | Freq: Every day | ORAL | Status: DC

## 2013-05-15 NOTE — Telephone Encounter (Signed)
Rx Refilled  

## 2013-06-12 ENCOUNTER — Ambulatory Visit: Payer: Medicare Other | Admitting: Family Medicine

## 2013-06-16 ENCOUNTER — Ambulatory Visit (INDEPENDENT_AMBULATORY_CARE_PROVIDER_SITE_OTHER): Payer: Medicare Other | Admitting: Family Medicine

## 2013-06-16 ENCOUNTER — Encounter: Payer: Self-pay | Admitting: Family Medicine

## 2013-06-16 VITALS — BP 150/72 | HR 72 | Temp 98.1°F | Resp 16 | Wt 211.0 lb

## 2013-06-16 DIAGNOSIS — IMO0002 Reserved for concepts with insufficient information to code with codable children: Secondary | ICD-10-CM

## 2013-06-16 DIAGNOSIS — M792 Neuralgia and neuritis, unspecified: Secondary | ICD-10-CM

## 2013-06-16 DIAGNOSIS — E785 Hyperlipidemia, unspecified: Secondary | ICD-10-CM | POA: Insufficient documentation

## 2013-06-16 DIAGNOSIS — G709 Myoneural disorder, unspecified: Secondary | ICD-10-CM | POA: Insufficient documentation

## 2013-06-16 HISTORY — DX: Hyperlipidemia, unspecified: E78.5

## 2013-06-16 NOTE — Addendum Note (Signed)
Addended by: WRAY, [place] on: 06/16/2013 03:03 PM   Modules accepted: Orders

## 2013-06-16 NOTE — Progress Notes (Signed)
Subjective:    Patient ID: [name], male    DOB: Mar 05, 1941, 72 y.o.   MRN: [id number]  HPI Patient continues to have severe neuropathic pain in both shoulders, his neck, and the posterior of both legs. It burns constantly. He first had a skin test at times. Glucose 75 mg by mouth twice a day is not effective anymore. He even missed church due to the pain yesterday.  He has a history of cervical laminectomy in 2011 with spinal shock and chronic pain secondary to that. He also has poor balance due to numbness in both feet. He walks with a cane.  He also has hyperlipidemia. He is currently on pravastatin 40 mg by mouth daily. He denies mouth or right upper quadrant pain. Past Medical History  Diagnosis Date  . TIA (transient ischemic attack)   . MI (myocardial infarction)   . Hypertension   . Weakness of left arm   . Weakness of left leg   . Neuromuscular disorder     neuropathic pain in legs and arms  . HLD (hyperlipidemia) 06/16/2013   Past Surgical History  Procedure Laterality Date  . Cervical fusion  04/2010    C3-C7   Current Outpatient Prescriptions on File Prior to Visit  Medication Sig Dispense Refill  . amLODipine (NORVASC) 10 MG tablet Take 1 tablet (10 mg total) by mouth daily.  30 tablet  5  . aspirin 81 MG tablet Take 81 mg by mouth daily.      Marland Kitchen desonide (DESOWEN) 0.05 % cream Apply 0.05 application topically.      . gabapentin (NEURONTIN) 600 MG tablet Take 600 mg by mouth 2 (two) times daily.      . meloxicam (MOBIC) 15 MG tablet Take 1 tablet (15 mg total) by mouth daily.  30 tablet  3  . metoprolol (LOPRESSOR) 50 MG tablet Take 1 tablet (50 mg total) by mouth 2 (two) times daily. 1.5 BID  60 tablet  1  . Multiple Vitamin (MULTIVITAMIN) tablet Take 1 tablet by mouth daily.      . pregabalin (LYRICA) 75 MG capsule Take 1 capsule (75 mg total) by mouth 2 (two) times daily.  60 capsule  5  . Tamsulosin HCl (FLOMAX) 0.4 MG CAPS Take 0.4 mg by mouth daily.       No  current facility-administered medications on file prior to visit.   Allergies no known allergies History   Social History  . Marital Status: Married    Spouse Name: N/A    Number of Children: N/A  . Years of Education: N/A   Occupational History  . Not on file.   Social History Main Topics  . Smoking status: Never Smoker   . Smokeless tobacco: Not on file  . Alcohol Use: Not on file  . Drug Use: Not on file  . Sexually Active: Not on file   Other Topics Concern  . Not on file   Social History Narrative  . No narrative on file      Review of Systems  All other systems reviewed and are negative.       Objective:   Physical Exam  Vitals reviewed. Constitutional: He is oriented to person, place, and time. He appears well-developed and well-nourished.  Neck: Neck supple. No JVD present.  Cardiovascular: Normal rate, regular rhythm and normal heart sounds.   No murmur heard. Pulmonary/Chest: Effort normal and breath sounds normal. No respiratory distress. He has no wheezes. He has no rales.  He exhibits no tenderness.  Musculoskeletal: He exhibits edema.  Lymphadenopathy:    He has no cervical adenopathy.  Neurological: He is alert and oriented to person, place, and time. He exhibits abnormal muscle tone. Coordination normal.  Psychiatric: He has a normal mood and affect. His behavior is normal. Judgment and thought content normal.          Assessment & Plan:  1. HLD (hyperlipidemia) Check CMP and fasting lipid panel. His goal LDL is less than 130. I'll also have the patient check his blood pressure daily and report to me the values in one to 2 weeks. His blood pressure is greater than 140/90 I will have him add chlorothiazide.. 2. Neuropathic pain Increase Lyrica to 150 mg by mouth 3 times a day. Also give the patient Norco 5/325 by mouth every 6 hours when necessary pain. Again 30 tablets with 0 refills. I'll have him recheck in 3 weeks and consider adding  Cymbalta if ineffective.

## 2013-06-17 ENCOUNTER — Encounter: Payer: Self-pay | Admitting: Family Medicine

## 2013-06-17 LAB — COMPLETE METABOLIC PANEL WITH GFR
AST: 18 U/L (ref 0–37)
Alkaline Phosphatase: 45 U/L (ref 39–117)
BUN: 21 mg/dL (ref 6–23)
Calcium: 9.7 mg/dL (ref 8.4–10.5)
Creat: 0.87 mg/dL (ref 0.50–1.35)
Total Bilirubin: 0.5 mg/dL (ref 0.3–1.2)

## 2013-06-17 LAB — LIPID PANEL
Cholesterol: 140 mg/dL (ref 0–200)
HDL: 31 mg/dL — ABNORMAL LOW (ref >39)
LDL Cholesterol: 69 mg/dL (ref 0–99)
Total CHOL/HDL Ratio: 4.5 Ratio
Triglycerides: 202 mg/dL — ABNORMAL HIGH (ref <150)
VLDL: 40 mg/dL (ref 0–40)

## 2013-06-21 ENCOUNTER — Telehealth: Payer: Self-pay | Admitting: Family Medicine

## 2013-06-21 MED ORDER — MELOXICAM 15 MG PO TABS: 15.0000 mg | ORAL_TABLET | Freq: Every day | ORAL | Status: DC

## 2013-06-21 NOTE — Telephone Encounter (Signed)
Rx Refilled  

## 2013-06-30 ENCOUNTER — Telehealth: Payer: Self-pay | Admitting: Family Medicine

## 2013-06-30 MED ORDER — METOPROLOL TARTRATE 50 MG PO TABS: ORAL_TABLET | ORAL | Status: DC

## 2013-06-30 NOTE — Telephone Encounter (Signed)
Rx Refilled  

## 2013-07-14 ENCOUNTER — Telehealth: Payer: Self-pay | Admitting: Family Medicine

## 2013-07-14 NOTE — Telephone Encounter (Signed)
17 135/84 16 145/91 15 132/80 14 146/86 13 139/84 12 149/97 11 140/85 10 136/88 9 130/88 8 150/97 7 159/99  This is on both of his blood pressure medications. He is also having some swelling in his ankles and wrists.

## 2013-07-15 NOTE — Telephone Encounter (Signed)
BP is still too high.  Add HCTZ 12.5 mg poqday.  This will lower BP and hopefully help with swelling.  Swelling is likely due to the combination of amlodipine and lyrica both of which cause swelling.

## 2013-07-15 NOTE — Telephone Encounter (Signed)
LMTRC

## 2013-07-16 MED ORDER — HYDROCHLOROTHIAZIDE 12.5 MG PO CAPS: 12.5000 mg | ORAL_CAPSULE | Freq: Every day | ORAL | Status: DC

## 2013-07-16 NOTE — Addendum Note (Signed)
Addended by: Legrand Rams B on: 07/16/2013 03:39 PM   Modules accepted: Orders

## 2013-07-16 NOTE — Telephone Encounter (Signed)
Patient aware and med sent to pharmacy.  

## 2013-08-05 ENCOUNTER — Telehealth: Payer: Self-pay | Admitting: Family Medicine

## 2013-08-05 NOTE — Telephone Encounter (Signed)
Ever since [name] started taking the HCTZ he has been having "crazy thoughts". Last Friday everything he looked as was blue. He says that his BP is not low, but wife says that it was. What should he do?

## 2013-08-07 NOTE — Telephone Encounter (Signed)
LMTRC

## 2013-08-07 NOTE — Telephone Encounter (Signed)
Patients wife aware

## 2013-08-07 NOTE — Telephone Encounter (Signed)
Try holding hctz and see if symptoms improve.  May also be lyrica.  Call us back first of next week.

## 2013-08-28 ENCOUNTER — Telehealth: Payer: Self-pay | Admitting: Family Medicine

## 2013-08-28 NOTE — Telephone Encounter (Signed)
HCTZ is a capsule so he can not cut it in half and he was on 12.5mg .

## 2013-08-28 NOTE — Telephone Encounter (Signed)
Pts BP has been running in the 140s/90s for the past week. He had a really bad nose bleed on Friday. Do you want to start him on another BP med (you just took him off of HCTZ because it was too low)?

## 2013-08-28 NOTE — Telephone Encounter (Signed)
Try cardura 2 mg poqday.

## 2013-08-28 NOTE — Telephone Encounter (Signed)
Can he try 1/2 HCTZ  Pill I called out.

## 2013-08-29 ENCOUNTER — Other Ambulatory Visit: Payer: Self-pay | Admitting: Family Medicine

## 2013-08-29 MED ORDER — DOXAZOSIN MESYLATE 2 MG PO TABS: 2.0000 mg | ORAL_TABLET | Freq: Every day | ORAL | Status: DC

## 2013-08-29 NOTE — Telephone Encounter (Signed)
Med sent to pharmacy and pt aware of change

## 2013-09-20 ENCOUNTER — Other Ambulatory Visit: Payer: Self-pay | Admitting: Family Medicine

## 2013-11-17 ENCOUNTER — Ambulatory Visit (INDEPENDENT_AMBULATORY_CARE_PROVIDER_SITE_OTHER): Payer: Medicare Other | Admitting: *Deleted

## 2013-11-17 DIAGNOSIS — Z23 Encounter for immunization: Secondary | ICD-10-CM

## 2013-11-25 DIAGNOSIS — H01009 Unspecified blepharitis unspecified eye, unspecified eyelid: Secondary | ICD-10-CM | POA: Diagnosis not present

## 2013-11-25 DIAGNOSIS — H52229 Regular astigmatism, unspecified eye: Secondary | ICD-10-CM | POA: Diagnosis not present

## 2013-11-25 DIAGNOSIS — H5200 Hypermetropia, unspecified eye: Secondary | ICD-10-CM | POA: Diagnosis not present

## 2013-11-25 DIAGNOSIS — H2510 Age-related nuclear cataract, unspecified eye: Secondary | ICD-10-CM | POA: Diagnosis not present

## 2013-12-22 DIAGNOSIS — N529 Male erectile dysfunction, unspecified: Secondary | ICD-10-CM | POA: Diagnosis not present

## 2013-12-22 DIAGNOSIS — M542 Cervicalgia: Secondary | ICD-10-CM | POA: Diagnosis not present

## 2013-12-22 DIAGNOSIS — I6789 Other cerebrovascular disease: Secondary | ICD-10-CM | POA: Diagnosis not present

## 2013-12-22 DIAGNOSIS — H579 Unspecified disorder of eye and adnexa: Secondary | ICD-10-CM | POA: Diagnosis not present

## 2013-12-23 ENCOUNTER — Other Ambulatory Visit: Payer: Self-pay | Admitting: Family Medicine

## 2013-12-24 ENCOUNTER — Other Ambulatory Visit: Payer: Self-pay | Admitting: Family Medicine

## 2014-02-18 ENCOUNTER — Other Ambulatory Visit: Payer: Self-pay | Admitting: Nurse Practitioner

## 2014-02-18 ENCOUNTER — Ambulatory Visit
Admission: RE | Admit: 2014-02-18 | Discharge: 2014-02-18 | Disposition: A | Discharge status: Home / Self Care | Payer: Medicare Other | Source: Ambulatory Visit | Attending: Nurse Practitioner | Admitting: Nurse Practitioner

## 2014-02-18 DIAGNOSIS — E7800 Pure hypercholesterolemia, unspecified: Secondary | ICD-10-CM | POA: Diagnosis not present

## 2014-02-18 DIAGNOSIS — R5383 Other fatigue: Secondary | ICD-10-CM | POA: Diagnosis not present

## 2014-02-18 DIAGNOSIS — I6350 Cerebral infarction due to unspecified occlusion or stenosis of unspecified cerebral artery: Secondary | ICD-10-CM | POA: Diagnosis not present

## 2014-02-18 DIAGNOSIS — N529 Male erectile dysfunction, unspecified: Secondary | ICD-10-CM | POA: Diagnosis not present

## 2014-02-18 DIAGNOSIS — R5381 Other malaise: Secondary | ICD-10-CM | POA: Diagnosis not present

## 2014-02-18 DIAGNOSIS — N400 Enlarged prostate without lower urinary tract symptoms: Secondary | ICD-10-CM | POA: Diagnosis not present

## 2014-02-18 DIAGNOSIS — R0602 Shortness of breath: Secondary | ICD-10-CM

## 2014-02-18 DIAGNOSIS — S058X9A Other injuries of unspecified eye and orbit, initial encounter: Secondary | ICD-10-CM | POA: Diagnosis not present

## 2014-02-18 DIAGNOSIS — Z0000 Encounter for general adult medical examination without abnormal findings: Secondary | ICD-10-CM | POA: Diagnosis not present

## 2014-02-18 DIAGNOSIS — Z8673 Personal history of transient ischemic attack (TIA), and cerebral infarction without residual deficits: Secondary | ICD-10-CM | POA: Diagnosis not present

## 2014-02-18 DIAGNOSIS — E559 Vitamin D deficiency, unspecified: Secondary | ICD-10-CM | POA: Diagnosis not present

## 2014-02-18 DIAGNOSIS — I6789 Other cerebrovascular disease: Secondary | ICD-10-CM | POA: Diagnosis not present

## 2014-02-18 DIAGNOSIS — Z0189 Encounter for other specified special examinations: Secondary | ICD-10-CM | POA: Diagnosis not present

## 2014-02-18 DIAGNOSIS — D649 Anemia, unspecified: Secondary | ICD-10-CM | POA: Diagnosis not present

## 2014-10-21 DIAGNOSIS — R739 Hyperglycemia, unspecified: Secondary | ICD-10-CM | POA: Diagnosis not present

## 2014-10-21 DIAGNOSIS — Z79899 Other long term (current) drug therapy: Secondary | ICD-10-CM | POA: Diagnosis not present

## 2014-10-21 DIAGNOSIS — E785 Hyperlipidemia, unspecified: Secondary | ICD-10-CM | POA: Diagnosis not present

## 2014-10-21 DIAGNOSIS — Z23 Encounter for immunization: Secondary | ICD-10-CM | POA: Diagnosis not present

## 2014-10-21 DIAGNOSIS — I2510 Atherosclerotic heart disease of native coronary artery without angina pectoris: Secondary | ICD-10-CM | POA: Diagnosis not present

## 2014-10-21 DIAGNOSIS — I10 Essential (primary) hypertension: Secondary | ICD-10-CM | POA: Diagnosis not present

## 2014-10-21 DIAGNOSIS — M792 Neuralgia and neuritis, unspecified: Secondary | ICD-10-CM | POA: Diagnosis not present

## 2014-10-30 DIAGNOSIS — E119 Type 2 diabetes mellitus without complications: Secondary | ICD-10-CM | POA: Diagnosis not present

## 2014-12-22 DIAGNOSIS — M25561 Pain in right knee: Secondary | ICD-10-CM | POA: Diagnosis not present

## 2015-01-13 DIAGNOSIS — I10 Essential (primary) hypertension: Secondary | ICD-10-CM | POA: Diagnosis not present

## 2015-01-13 DIAGNOSIS — E1165 Type 2 diabetes mellitus with hyperglycemia: Secondary | ICD-10-CM | POA: Diagnosis not present

## 2015-02-22 DIAGNOSIS — Z79899 Other long term (current) drug therapy: Secondary | ICD-10-CM | POA: Diagnosis not present

## 2015-02-22 DIAGNOSIS — M79676 Pain in unspecified toe(s): Secondary | ICD-10-CM | POA: Diagnosis not present

## 2015-02-22 DIAGNOSIS — E1165 Type 2 diabetes mellitus with hyperglycemia: Secondary | ICD-10-CM | POA: Diagnosis not present

## 2015-02-22 DIAGNOSIS — I10 Essential (primary) hypertension: Secondary | ICD-10-CM | POA: Diagnosis not present

## 2015-05-27 DIAGNOSIS — E1165 Type 2 diabetes mellitus with hyperglycemia: Secondary | ICD-10-CM | POA: Diagnosis not present

## 2015-05-27 DIAGNOSIS — G709 Myoneural disorder, unspecified: Secondary | ICD-10-CM | POA: Diagnosis not present

## 2015-05-27 DIAGNOSIS — Z23 Encounter for immunization: Secondary | ICD-10-CM | POA: Diagnosis not present

## 2015-05-27 DIAGNOSIS — I2510 Atherosclerotic heart disease of native coronary artery without angina pectoris: Secondary | ICD-10-CM | POA: Diagnosis not present

## 2015-05-27 DIAGNOSIS — I10 Essential (primary) hypertension: Secondary | ICD-10-CM | POA: Diagnosis not present

## 2015-05-27 DIAGNOSIS — Z0001 Encounter for general adult medical examination with abnormal findings: Secondary | ICD-10-CM | POA: Diagnosis not present

## 2015-05-27 DIAGNOSIS — E785 Hyperlipidemia, unspecified: Secondary | ICD-10-CM | POA: Diagnosis not present

## 2015-05-27 DIAGNOSIS — M792 Neuralgia and neuritis, unspecified: Secondary | ICD-10-CM | POA: Diagnosis not present

## 2015-05-27 DIAGNOSIS — Z79899 Other long term (current) drug therapy: Secondary | ICD-10-CM | POA: Diagnosis not present

## 2015-06-04 ENCOUNTER — Other Ambulatory Visit: Payer: Self-pay | Admitting: Family Medicine

## 2015-06-04 DIAGNOSIS — Z139 Encounter for screening, unspecified: Secondary | ICD-10-CM

## 2015-06-07 DIAGNOSIS — E1165 Type 2 diabetes mellitus with hyperglycemia: Secondary | ICD-10-CM | POA: Diagnosis not present

## 2015-06-07 DIAGNOSIS — Z1211 Encounter for screening for malignant neoplasm of colon: Secondary | ICD-10-CM | POA: Diagnosis not present

## 2015-06-09 ENCOUNTER — Ambulatory Visit
Admission: RE | Admit: 2015-06-09 | Discharge: 2015-06-09 | Disposition: A | Discharge status: Home / Self Care | Payer: Medicare Other | Source: Ambulatory Visit | Attending: Family Medicine | Admitting: Family Medicine

## 2015-06-09 DIAGNOSIS — I714 Abdominal aortic aneurysm, without rupture: Secondary | ICD-10-CM | POA: Diagnosis not present

## 2015-06-09 DIAGNOSIS — Z139 Encounter for screening, unspecified: Secondary | ICD-10-CM

## 2015-06-10 ENCOUNTER — Other Ambulatory Visit: Payer: Self-pay | Admitting: Dermatology

## 2015-06-10 ENCOUNTER — Encounter: Payer: Self-pay | Admitting: Family Medicine

## 2015-06-10 DIAGNOSIS — L820 Inflamed seborrheic keratosis: Secondary | ICD-10-CM | POA: Diagnosis not present

## 2015-06-10 DIAGNOSIS — L570 Actinic keratosis: Secondary | ICD-10-CM | POA: Diagnosis not present

## 2015-06-10 DIAGNOSIS — D485 Neoplasm of uncertain behavior of skin: Secondary | ICD-10-CM | POA: Diagnosis not present

## 2015-08-03 DIAGNOSIS — M25571 Pain in right ankle and joints of right foot: Secondary | ICD-10-CM | POA: Diagnosis not present

## 2015-09-27 DIAGNOSIS — Z23 Encounter for immunization: Secondary | ICD-10-CM | POA: Diagnosis not present

## 2015-09-27 DIAGNOSIS — E1165 Type 2 diabetes mellitus with hyperglycemia: Secondary | ICD-10-CM | POA: Diagnosis not present

## 2015-09-27 DIAGNOSIS — M25539 Pain in unspecified wrist: Secondary | ICD-10-CM | POA: Diagnosis not present

## 2016-03-05 DIAGNOSIS — R300 Dysuria: Secondary | ICD-10-CM | POA: Diagnosis not present

## 2016-03-06 DIAGNOSIS — H25813 Combined forms of age-related cataract, bilateral: Secondary | ICD-10-CM | POA: Diagnosis not present

## 2016-03-06 DIAGNOSIS — H5203 Hypermetropia, bilateral: Secondary | ICD-10-CM | POA: Diagnosis not present

## 2016-03-06 DIAGNOSIS — H524 Presbyopia: Secondary | ICD-10-CM | POA: Diagnosis not present

## 2016-03-06 DIAGNOSIS — H52223 Regular astigmatism, bilateral: Secondary | ICD-10-CM | POA: Diagnosis not present

## 2016-03-07 DIAGNOSIS — H2513 Age-related nuclear cataract, bilateral: Secondary | ICD-10-CM | POA: Diagnosis not present

## 2016-04-03 DIAGNOSIS — L089 Local infection of the skin and subcutaneous tissue, unspecified: Secondary | ICD-10-CM | POA: Diagnosis not present

## 2016-04-03 DIAGNOSIS — R200 Anesthesia of skin: Secondary | ICD-10-CM | POA: Diagnosis not present

## 2016-04-03 DIAGNOSIS — M545 Low back pain: Secondary | ICD-10-CM | POA: Diagnosis not present

## 2016-05-18 DIAGNOSIS — H2512 Age-related nuclear cataract, left eye: Secondary | ICD-10-CM | POA: Diagnosis not present

## 2016-05-18 DIAGNOSIS — E119 Type 2 diabetes mellitus without complications: Secondary | ICD-10-CM | POA: Diagnosis not present

## 2016-06-13 DIAGNOSIS — I2510 Atherosclerotic heart disease of native coronary artery without angina pectoris: Secondary | ICD-10-CM | POA: Diagnosis not present

## 2016-06-13 DIAGNOSIS — G8929 Other chronic pain: Secondary | ICD-10-CM | POA: Diagnosis not present

## 2016-06-13 DIAGNOSIS — M545 Low back pain: Secondary | ICD-10-CM | POA: Diagnosis not present

## 2016-06-13 DIAGNOSIS — Z23 Encounter for immunization: Secondary | ICD-10-CM | POA: Diagnosis not present

## 2016-06-13 DIAGNOSIS — Z0001 Encounter for general adult medical examination with abnormal findings: Secondary | ICD-10-CM | POA: Diagnosis not present

## 2016-06-13 DIAGNOSIS — M792 Neuralgia and neuritis, unspecified: Secondary | ICD-10-CM | POA: Diagnosis not present

## 2016-06-13 DIAGNOSIS — E7800 Pure hypercholesterolemia, unspecified: Secondary | ICD-10-CM | POA: Diagnosis not present

## 2016-06-13 DIAGNOSIS — Z79899 Other long term (current) drug therapy: Secondary | ICD-10-CM | POA: Diagnosis not present

## 2016-06-13 DIAGNOSIS — Z7984 Long term (current) use of oral hypoglycemic drugs: Secondary | ICD-10-CM | POA: Diagnosis not present

## 2016-06-13 DIAGNOSIS — E1165 Type 2 diabetes mellitus with hyperglycemia: Secondary | ICD-10-CM | POA: Diagnosis not present

## 2016-06-13 DIAGNOSIS — I10 Essential (primary) hypertension: Secondary | ICD-10-CM | POA: Diagnosis not present

## 2016-06-15 DIAGNOSIS — H20011 Primary iridocyclitis, right eye: Secondary | ICD-10-CM | POA: Diagnosis not present

## 2016-06-15 DIAGNOSIS — H2511 Age-related nuclear cataract, right eye: Secondary | ICD-10-CM | POA: Diagnosis not present

## 2016-06-15 DIAGNOSIS — H524 Presbyopia: Secondary | ICD-10-CM | POA: Diagnosis not present

## 2016-06-15 DIAGNOSIS — H25041 Posterior subcapsular polar age-related cataract, right eye: Secondary | ICD-10-CM | POA: Diagnosis not present

## 2016-06-15 DIAGNOSIS — H52223 Regular astigmatism, bilateral: Secondary | ICD-10-CM | POA: Diagnosis not present

## 2016-06-15 DIAGNOSIS — H5201 Hypermetropia, right eye: Secondary | ICD-10-CM | POA: Diagnosis not present

## 2016-06-15 DIAGNOSIS — Z961 Presence of intraocular lens: Secondary | ICD-10-CM | POA: Diagnosis not present

## 2016-06-23 DIAGNOSIS — Z1211 Encounter for screening for malignant neoplasm of colon: Secondary | ICD-10-CM | POA: Diagnosis not present

## 2016-09-25 DIAGNOSIS — E1165 Type 2 diabetes mellitus with hyperglycemia: Secondary | ICD-10-CM | POA: Diagnosis not present

## 2016-09-25 DIAGNOSIS — R42 Dizziness and giddiness: Secondary | ICD-10-CM | POA: Diagnosis not present

## 2016-09-25 DIAGNOSIS — H6123 Impacted cerumen, bilateral: Secondary | ICD-10-CM | POA: Diagnosis not present

## 2016-09-25 DIAGNOSIS — Z7984 Long term (current) use of oral hypoglycemic drugs: Secondary | ICD-10-CM | POA: Diagnosis not present

## 2017-01-19 DIAGNOSIS — R7309 Other abnormal glucose: Secondary | ICD-10-CM | POA: Diagnosis not present

## 2017-01-19 DIAGNOSIS — I10 Essential (primary) hypertension: Secondary | ICD-10-CM | POA: Diagnosis not present

## 2017-01-19 DIAGNOSIS — E119 Type 2 diabetes mellitus without complications: Secondary | ICD-10-CM | POA: Diagnosis not present

## 2017-01-19 DIAGNOSIS — E7800 Pure hypercholesterolemia, unspecified: Secondary | ICD-10-CM | POA: Diagnosis not present

## 2017-01-19 DIAGNOSIS — Z79899 Other long term (current) drug therapy: Secondary | ICD-10-CM | POA: Diagnosis not present

## 2017-02-09 DIAGNOSIS — I10 Essential (primary) hypertension: Secondary | ICD-10-CM | POA: Diagnosis not present

## 2017-02-28 DIAGNOSIS — Z87891 Personal history of nicotine dependence: Secondary | ICD-10-CM | POA: Diagnosis not present

## 2017-02-28 DIAGNOSIS — R42 Dizziness and giddiness: Secondary | ICD-10-CM | POA: Diagnosis not present

## 2017-02-28 DIAGNOSIS — Z8739 Personal history of other diseases of the musculoskeletal system and connective tissue: Secondary | ICD-10-CM | POA: Diagnosis not present

## 2017-02-28 DIAGNOSIS — H6123 Impacted cerumen, bilateral: Secondary | ICD-10-CM | POA: Diagnosis not present

## 2017-03-07 DIAGNOSIS — R0789 Other chest pain: Secondary | ICD-10-CM | POA: Diagnosis not present

## 2017-03-07 DIAGNOSIS — Z79899 Other long term (current) drug therapy: Secondary | ICD-10-CM | POA: Diagnosis not present

## 2017-03-07 DIAGNOSIS — Z8673 Personal history of transient ischemic attack (TIA), and cerebral infarction without residual deficits: Secondary | ICD-10-CM | POA: Diagnosis not present

## 2017-03-07 DIAGNOSIS — W19XXXA Unspecified fall, initial encounter: Secondary | ICD-10-CM | POA: Diagnosis not present

## 2017-03-07 DIAGNOSIS — M25521 Pain in right elbow: Secondary | ICD-10-CM | POA: Diagnosis not present

## 2017-03-07 DIAGNOSIS — S299XXA Unspecified injury of thorax, initial encounter: Secondary | ICD-10-CM | POA: Diagnosis not present

## 2017-03-07 DIAGNOSIS — F329 Major depressive disorder, single episode, unspecified: Secondary | ICD-10-CM | POA: Diagnosis not present

## 2017-03-07 DIAGNOSIS — I10 Essential (primary) hypertension: Secondary | ICD-10-CM | POA: Diagnosis not present

## 2017-03-07 DIAGNOSIS — M542 Cervicalgia: Secondary | ICD-10-CM | POA: Diagnosis not present

## 2017-03-07 DIAGNOSIS — Z981 Arthrodesis status: Secondary | ICD-10-CM | POA: Diagnosis not present

## 2017-03-07 DIAGNOSIS — E119 Type 2 diabetes mellitus without complications: Secondary | ICD-10-CM | POA: Diagnosis not present

## 2017-03-07 DIAGNOSIS — S0003XA Contusion of scalp, initial encounter: Secondary | ICD-10-CM | POA: Diagnosis not present

## 2017-03-07 DIAGNOSIS — Z7982 Long term (current) use of aspirin: Secondary | ICD-10-CM | POA: Diagnosis not present

## 2017-03-07 DIAGNOSIS — Z7984 Long term (current) use of oral hypoglycemic drugs: Secondary | ICD-10-CM | POA: Diagnosis not present

## 2017-03-07 DIAGNOSIS — H1132 Conjunctival hemorrhage, left eye: Secondary | ICD-10-CM | POA: Diagnosis not present

## 2017-03-07 DIAGNOSIS — R51 Headache: Secondary | ICD-10-CM | POA: Diagnosis not present

## 2017-03-07 DIAGNOSIS — S0990XA Unspecified injury of head, initial encounter: Secondary | ICD-10-CM | POA: Diagnosis not present

## 2017-03-07 DIAGNOSIS — T148XXA Other injury of unspecified body region, initial encounter: Secondary | ICD-10-CM | POA: Diagnosis not present

## 2017-03-07 DIAGNOSIS — S0081XA Abrasion of other part of head, initial encounter: Secondary | ICD-10-CM | POA: Diagnosis not present

## 2017-03-07 DIAGNOSIS — Z87891 Personal history of nicotine dependence: Secondary | ICD-10-CM | POA: Diagnosis not present

## 2017-03-07 DIAGNOSIS — M778 Other enthesopathies, not elsewhere classified: Secondary | ICD-10-CM | POA: Diagnosis not present

## 2017-03-07 DIAGNOSIS — I252 Old myocardial infarction: Secondary | ICD-10-CM | POA: Diagnosis not present

## 2017-03-07 DIAGNOSIS — M25522 Pain in left elbow: Secondary | ICD-10-CM | POA: Diagnosis not present

## 2017-04-25 DIAGNOSIS — E119 Type 2 diabetes mellitus without complications: Secondary | ICD-10-CM | POA: Diagnosis not present

## 2017-06-06 ENCOUNTER — Other Ambulatory Visit: Payer: Self-pay | Admitting: Family Medicine

## 2017-06-06 DIAGNOSIS — Z87891 Personal history of nicotine dependence: Secondary | ICD-10-CM

## 2017-06-18 ENCOUNTER — Ambulatory Visit
Admission: RE | Admit: 2017-06-18 | Discharge: 2017-06-18 | Disposition: A | Discharge status: Home / Self Care | Payer: Medicare Other | Source: Ambulatory Visit | Attending: Family Medicine | Admitting: Family Medicine

## 2017-06-18 DIAGNOSIS — Z87891 Personal history of nicotine dependence: Secondary | ICD-10-CM | POA: Diagnosis not present

## 2017-06-18 DIAGNOSIS — I714 Abdominal aortic aneurysm, without rupture: Secondary | ICD-10-CM | POA: Diagnosis not present

## 2017-06-20 DIAGNOSIS — Z79899 Other long term (current) drug therapy: Secondary | ICD-10-CM | POA: Diagnosis not present

## 2017-06-20 DIAGNOSIS — I714 Abdominal aortic aneurysm, without rupture: Secondary | ICD-10-CM | POA: Diagnosis not present

## 2017-06-20 DIAGNOSIS — I10 Essential (primary) hypertension: Secondary | ICD-10-CM | POA: Diagnosis not present

## 2017-06-20 DIAGNOSIS — E1165 Type 2 diabetes mellitus with hyperglycemia: Secondary | ICD-10-CM | POA: Diagnosis not present

## 2017-06-20 DIAGNOSIS — Z0001 Encounter for general adult medical examination with abnormal findings: Secondary | ICD-10-CM | POA: Diagnosis not present

## 2017-06-20 DIAGNOSIS — I2510 Atherosclerotic heart disease of native coronary artery without angina pectoris: Secondary | ICD-10-CM | POA: Diagnosis not present

## 2017-06-20 DIAGNOSIS — M792 Neuralgia and neuritis, unspecified: Secondary | ICD-10-CM | POA: Diagnosis not present

## 2017-06-20 DIAGNOSIS — Z1211 Encounter for screening for malignant neoplasm of colon: Secondary | ICD-10-CM | POA: Diagnosis not present

## 2017-07-19 DIAGNOSIS — R202 Paresthesia of skin: Secondary | ICD-10-CM | POA: Diagnosis not present

## 2017-07-19 DIAGNOSIS — R42 Dizziness and giddiness: Secondary | ICD-10-CM | POA: Diagnosis not present

## 2017-07-24 ENCOUNTER — Other Ambulatory Visit: Payer: Self-pay | Admitting: Family Medicine

## 2017-07-24 DIAGNOSIS — R519 Headache, unspecified: Secondary | ICD-10-CM

## 2017-07-24 DIAGNOSIS — R51 Headache: Secondary | ICD-10-CM

## 2017-07-24 DIAGNOSIS — R200 Anesthesia of skin: Secondary | ICD-10-CM

## 2017-08-04 ENCOUNTER — Ambulatory Visit
Admission: RE | Admit: 2017-08-04 | Discharge: 2017-08-04 | Disposition: A | Discharge status: Home / Self Care | Payer: Medicare Other | Source: Ambulatory Visit | Attending: Family Medicine | Admitting: Family Medicine

## 2017-08-04 DIAGNOSIS — R200 Anesthesia of skin: Secondary | ICD-10-CM

## 2017-08-04 DIAGNOSIS — R51 Headache: Secondary | ICD-10-CM | POA: Diagnosis not present

## 2017-08-04 DIAGNOSIS — R519 Headache, unspecified: Secondary | ICD-10-CM

## 2017-10-10 DIAGNOSIS — Z23 Encounter for immunization: Secondary | ICD-10-CM | POA: Diagnosis not present

## 2017-12-24 DIAGNOSIS — E1165 Type 2 diabetes mellitus with hyperglycemia: Secondary | ICD-10-CM | POA: Diagnosis not present

## 2017-12-24 DIAGNOSIS — I2510 Atherosclerotic heart disease of native coronary artery without angina pectoris: Secondary | ICD-10-CM | POA: Diagnosis not present

## 2017-12-24 DIAGNOSIS — E7800 Pure hypercholesterolemia, unspecified: Secondary | ICD-10-CM | POA: Diagnosis not present

## 2017-12-24 DIAGNOSIS — Z79899 Other long term (current) drug therapy: Secondary | ICD-10-CM | POA: Diagnosis not present

## 2017-12-24 DIAGNOSIS — Z7984 Long term (current) use of oral hypoglycemic drugs: Secondary | ICD-10-CM | POA: Diagnosis not present

## 2017-12-24 DIAGNOSIS — M792 Neuralgia and neuritis, unspecified: Secondary | ICD-10-CM | POA: Diagnosis not present

## 2017-12-24 DIAGNOSIS — I10 Essential (primary) hypertension: Secondary | ICD-10-CM | POA: Diagnosis not present

## 2018-06-19 ENCOUNTER — Other Ambulatory Visit: Payer: Self-pay | Admitting: Family Medicine

## 2018-06-19 DIAGNOSIS — I714 Abdominal aortic aneurysm, without rupture, unspecified: Secondary | ICD-10-CM

## 2018-06-21 ENCOUNTER — Ambulatory Visit
Admission: RE | Admit: 2018-06-21 | Discharge: 2018-06-21 | Disposition: A | Discharge status: Home / Self Care | Payer: Medicare Other | Source: Ambulatory Visit | Attending: Family Medicine | Admitting: Family Medicine

## 2018-06-21 DIAGNOSIS — I714 Abdominal aortic aneurysm, without rupture, unspecified: Secondary | ICD-10-CM

## 2018-06-24 DIAGNOSIS — Z0001 Encounter for general adult medical examination with abnormal findings: Secondary | ICD-10-CM | POA: Diagnosis not present

## 2018-06-24 DIAGNOSIS — I10 Essential (primary) hypertension: Secondary | ICD-10-CM | POA: Diagnosis not present

## 2018-06-24 DIAGNOSIS — L570 Actinic keratosis: Secondary | ICD-10-CM | POA: Diagnosis not present

## 2018-06-24 DIAGNOSIS — E1165 Type 2 diabetes mellitus with hyperglycemia: Secondary | ICD-10-CM | POA: Diagnosis not present

## 2018-06-24 DIAGNOSIS — R05 Cough: Secondary | ICD-10-CM | POA: Diagnosis not present

## 2018-06-24 DIAGNOSIS — I714 Abdominal aortic aneurysm, without rupture: Secondary | ICD-10-CM | POA: Diagnosis not present

## 2018-06-24 DIAGNOSIS — M792 Neuralgia and neuritis, unspecified: Secondary | ICD-10-CM | POA: Diagnosis not present

## 2018-06-24 DIAGNOSIS — Z136 Encounter for screening for cardiovascular disorders: Secondary | ICD-10-CM | POA: Diagnosis not present

## 2018-06-24 DIAGNOSIS — Z79899 Other long term (current) drug therapy: Secondary | ICD-10-CM | POA: Diagnosis not present

## 2018-06-27 ENCOUNTER — Other Ambulatory Visit: Payer: Self-pay | Admitting: Dermatology

## 2018-06-27 DIAGNOSIS — D485 Neoplasm of uncertain behavior of skin: Secondary | ICD-10-CM | POA: Diagnosis not present

## 2018-06-27 DIAGNOSIS — L821 Other seborrheic keratosis: Secondary | ICD-10-CM | POA: Diagnosis not present

## 2018-06-27 DIAGNOSIS — L570 Actinic keratosis: Secondary | ICD-10-CM | POA: Diagnosis not present

## 2018-06-27 DIAGNOSIS — L820 Inflamed seborrheic keratosis: Secondary | ICD-10-CM | POA: Diagnosis not present

## 2018-06-27 DIAGNOSIS — D229 Melanocytic nevi, unspecified: Secondary | ICD-10-CM | POA: Diagnosis not present

## 2018-06-27 DIAGNOSIS — I872 Venous insufficiency (chronic) (peripheral): Secondary | ICD-10-CM | POA: Diagnosis not present

## 2018-09-19 DIAGNOSIS — J069 Acute upper respiratory infection, unspecified: Secondary | ICD-10-CM | POA: Diagnosis not present

## 2018-09-19 DIAGNOSIS — J209 Acute bronchitis, unspecified: Secondary | ICD-10-CM | POA: Diagnosis not present

## 2018-12-17 DIAGNOSIS — Z23 Encounter for immunization: Secondary | ICD-10-CM | POA: Diagnosis not present

## 2018-12-17 DIAGNOSIS — M792 Neuralgia and neuritis, unspecified: Secondary | ICD-10-CM | POA: Diagnosis not present

## 2018-12-17 DIAGNOSIS — G8929 Other chronic pain: Secondary | ICD-10-CM | POA: Diagnosis not present

## 2018-12-17 DIAGNOSIS — Z79899 Other long term (current) drug therapy: Secondary | ICD-10-CM | POA: Diagnosis not present

## 2018-12-17 DIAGNOSIS — Z7984 Long term (current) use of oral hypoglycemic drugs: Secondary | ICD-10-CM | POA: Diagnosis not present

## 2018-12-17 DIAGNOSIS — E7800 Pure hypercholesterolemia, unspecified: Secondary | ICD-10-CM | POA: Diagnosis not present

## 2018-12-17 DIAGNOSIS — I714 Abdominal aortic aneurysm, without rupture: Secondary | ICD-10-CM | POA: Diagnosis not present

## 2018-12-17 DIAGNOSIS — I10 Essential (primary) hypertension: Secondary | ICD-10-CM | POA: Diagnosis not present

## 2018-12-17 DIAGNOSIS — E1165 Type 2 diabetes mellitus with hyperglycemia: Secondary | ICD-10-CM | POA: Diagnosis not present

## 2019-05-09 DIAGNOSIS — Z7984 Long term (current) use of oral hypoglycemic drugs: Secondary | ICD-10-CM | POA: Diagnosis not present

## 2019-05-09 DIAGNOSIS — Z1211 Encounter for screening for malignant neoplasm of colon: Secondary | ICD-10-CM | POA: Diagnosis not present

## 2019-05-09 DIAGNOSIS — Z0001 Encounter for general adult medical examination with abnormal findings: Secondary | ICD-10-CM | POA: Diagnosis not present

## 2019-05-09 DIAGNOSIS — I10 Essential (primary) hypertension: Secondary | ICD-10-CM | POA: Diagnosis not present

## 2019-05-09 DIAGNOSIS — M792 Neuralgia and neuritis, unspecified: Secondary | ICD-10-CM | POA: Diagnosis not present

## 2019-05-09 DIAGNOSIS — E7800 Pure hypercholesterolemia, unspecified: Secondary | ICD-10-CM | POA: Diagnosis not present

## 2019-05-09 DIAGNOSIS — E1165 Type 2 diabetes mellitus with hyperglycemia: Secondary | ICD-10-CM | POA: Diagnosis not present

## 2019-05-09 DIAGNOSIS — Z79899 Other long term (current) drug therapy: Secondary | ICD-10-CM | POA: Diagnosis not present

## 2019-05-09 DIAGNOSIS — I714 Abdominal aortic aneurysm, without rupture: Secondary | ICD-10-CM | POA: Diagnosis not present

## 2019-05-12 DIAGNOSIS — Z79899 Other long term (current) drug therapy: Secondary | ICD-10-CM | POA: Diagnosis not present

## 2019-06-25 DIAGNOSIS — H00021 Hordeolum internum right upper eyelid: Secondary | ICD-10-CM | POA: Diagnosis not present

## 2019-06-27 DIAGNOSIS — H00021 Hordeolum internum right upper eyelid: Secondary | ICD-10-CM | POA: Diagnosis not present

## 2019-07-11 DIAGNOSIS — R69 Illness, unspecified: Secondary | ICD-10-CM | POA: Diagnosis not present

## 2019-10-27 DIAGNOSIS — R69 Illness, unspecified: Secondary | ICD-10-CM | POA: Diagnosis not present

## 2019-10-29 DIAGNOSIS — R69 Illness, unspecified: Secondary | ICD-10-CM | POA: Diagnosis not present

## 2019-12-15 DIAGNOSIS — E1165 Type 2 diabetes mellitus with hyperglycemia: Secondary | ICD-10-CM | POA: Diagnosis not present

## 2019-12-19 DIAGNOSIS — U071 COVID-19: Secondary | ICD-10-CM | POA: Diagnosis not present

## 2020-01-19 DIAGNOSIS — R69 Illness, unspecified: Secondary | ICD-10-CM | POA: Diagnosis not present

## 2020-04-15 DIAGNOSIS — R69 Illness, unspecified: Secondary | ICD-10-CM | POA: Diagnosis not present

## 2020-05-28 DIAGNOSIS — Z0001 Encounter for general adult medical examination with abnormal findings: Secondary | ICD-10-CM | POA: Diagnosis not present

## 2020-05-28 DIAGNOSIS — Z7984 Long term (current) use of oral hypoglycemic drugs: Secondary | ICD-10-CM | POA: Diagnosis not present

## 2020-05-28 DIAGNOSIS — E7800 Pure hypercholesterolemia, unspecified: Secondary | ICD-10-CM | POA: Diagnosis not present

## 2020-05-28 DIAGNOSIS — I10 Essential (primary) hypertension: Secondary | ICD-10-CM | POA: Diagnosis not present

## 2020-05-28 DIAGNOSIS — M792 Neuralgia and neuritis, unspecified: Secondary | ICD-10-CM | POA: Diagnosis not present

## 2020-05-28 DIAGNOSIS — I714 Abdominal aortic aneurysm, without rupture: Secondary | ICD-10-CM | POA: Diagnosis not present

## 2020-05-28 DIAGNOSIS — I2510 Atherosclerotic heart disease of native coronary artery without angina pectoris: Secondary | ICD-10-CM | POA: Diagnosis not present

## 2020-05-28 DIAGNOSIS — Z79899 Other long term (current) drug therapy: Secondary | ICD-10-CM | POA: Diagnosis not present

## 2020-05-28 DIAGNOSIS — E1169 Type 2 diabetes mellitus with other specified complication: Secondary | ICD-10-CM | POA: Diagnosis not present

## 2020-06-01 DIAGNOSIS — Z7984 Long term (current) use of oral hypoglycemic drugs: Secondary | ICD-10-CM | POA: Diagnosis not present

## 2020-06-01 DIAGNOSIS — E1169 Type 2 diabetes mellitus with other specified complication: Secondary | ICD-10-CM | POA: Diagnosis not present

## 2020-06-01 DIAGNOSIS — Z1211 Encounter for screening for malignant neoplasm of colon: Secondary | ICD-10-CM | POA: Diagnosis not present

## 2020-06-02 DIAGNOSIS — Z0100 Encounter for examination of eyes and vision without abnormal findings: Secondary | ICD-10-CM | POA: Diagnosis not present

## 2020-06-02 DIAGNOSIS — E119 Type 2 diabetes mellitus without complications: Secondary | ICD-10-CM | POA: Diagnosis not present

## 2020-06-24 DIAGNOSIS — H01013 Ulcerative blepharitis right eye, unspecified eyelid: Secondary | ICD-10-CM | POA: Diagnosis not present

## 2020-06-24 DIAGNOSIS — H16259 Phlyctenular keratoconjunctivitis, unspecified eye: Secondary | ICD-10-CM | POA: Diagnosis not present

## 2020-06-29 DIAGNOSIS — H16259 Phlyctenular keratoconjunctivitis, unspecified eye: Secondary | ICD-10-CM | POA: Diagnosis not present

## 2020-07-15 DIAGNOSIS — E1165 Type 2 diabetes mellitus with hyperglycemia: Secondary | ICD-10-CM | POA: Diagnosis not present

## 2020-07-15 DIAGNOSIS — S80812A Abrasion, left lower leg, initial encounter: Secondary | ICD-10-CM | POA: Diagnosis not present

## 2020-07-15 DIAGNOSIS — L089 Local infection of the skin and subcutaneous tissue, unspecified: Secondary | ICD-10-CM | POA: Diagnosis not present

## 2020-07-23 DIAGNOSIS — R531 Weakness: Secondary | ICD-10-CM | POA: Diagnosis not present

## 2020-07-23 DIAGNOSIS — N132 Hydronephrosis with renal and ureteral calculous obstruction: Secondary | ICD-10-CM | POA: Diagnosis not present

## 2020-07-23 DIAGNOSIS — N23 Unspecified renal colic: Secondary | ICD-10-CM | POA: Diagnosis not present

## 2020-07-23 DIAGNOSIS — R05 Cough: Secondary | ICD-10-CM | POA: Diagnosis not present

## 2020-07-23 DIAGNOSIS — R110 Nausea: Secondary | ICD-10-CM | POA: Diagnosis not present

## 2020-07-23 DIAGNOSIS — R6883 Chills (without fever): Secondary | ICD-10-CM | POA: Diagnosis not present

## 2020-07-23 DIAGNOSIS — I714 Abdominal aortic aneurysm, without rupture: Secondary | ICD-10-CM | POA: Diagnosis not present

## 2020-07-23 DIAGNOSIS — Z8673 Personal history of transient ischemic attack (TIA), and cerebral infarction without residual deficits: Secondary | ICD-10-CM | POA: Diagnosis not present

## 2020-07-23 DIAGNOSIS — Z20822 Contact with and (suspected) exposure to covid-19: Secondary | ICD-10-CM | POA: Diagnosis not present

## 2020-07-23 DIAGNOSIS — R9431 Abnormal electrocardiogram [ECG] [EKG]: Secondary | ICD-10-CM | POA: Diagnosis not present

## 2020-07-23 DIAGNOSIS — Z87891 Personal history of nicotine dependence: Secondary | ICD-10-CM | POA: Diagnosis not present

## 2020-07-24 DIAGNOSIS — R9431 Abnormal electrocardiogram [ECG] [EKG]: Secondary | ICD-10-CM | POA: Diagnosis not present

## 2020-07-27 DIAGNOSIS — R69 Illness, unspecified: Secondary | ICD-10-CM | POA: Diagnosis not present

## 2020-12-01 DIAGNOSIS — I714 Abdominal aortic aneurysm, without rupture: Secondary | ICD-10-CM | POA: Diagnosis not present

## 2020-12-01 DIAGNOSIS — E1169 Type 2 diabetes mellitus with other specified complication: Secondary | ICD-10-CM | POA: Diagnosis not present

## 2020-12-01 DIAGNOSIS — L989 Disorder of the skin and subcutaneous tissue, unspecified: Secondary | ICD-10-CM | POA: Diagnosis not present

## 2020-12-01 DIAGNOSIS — Z79899 Other long term (current) drug therapy: Secondary | ICD-10-CM | POA: Diagnosis not present

## 2020-12-01 DIAGNOSIS — M792 Neuralgia and neuritis, unspecified: Secondary | ICD-10-CM | POA: Diagnosis not present

## 2020-12-01 DIAGNOSIS — I10 Essential (primary) hypertension: Secondary | ICD-10-CM | POA: Diagnosis not present

## 2020-12-01 DIAGNOSIS — E7800 Pure hypercholesterolemia, unspecified: Secondary | ICD-10-CM | POA: Diagnosis not present

## 2020-12-01 DIAGNOSIS — I2510 Atherosclerotic heart disease of native coronary artery without angina pectoris: Secondary | ICD-10-CM | POA: Diagnosis not present

## 2020-12-01 DIAGNOSIS — G709 Myoneural disorder, unspecified: Secondary | ICD-10-CM | POA: Diagnosis not present

## 2021-02-22 ENCOUNTER — Ambulatory Visit: Payer: Medicare Other | Admitting: Dermatology

## 2021-02-22 ENCOUNTER — Other Ambulatory Visit: Payer: Self-pay

## 2021-02-22 ENCOUNTER — Encounter: Payer: Self-pay | Admitting: Dermatology

## 2021-02-22 DIAGNOSIS — L570 Actinic keratosis: Secondary | ICD-10-CM | POA: Diagnosis not present

## 2021-02-22 DIAGNOSIS — L821 Other seborrheic keratosis: Secondary | ICD-10-CM

## 2021-02-22 DIAGNOSIS — C44612 Basal cell carcinoma of skin of right upper limb, including shoulder: Secondary | ICD-10-CM

## 2021-02-22 DIAGNOSIS — L820 Inflamed seborrheic keratosis: Secondary | ICD-10-CM | POA: Diagnosis not present

## 2021-02-22 DIAGNOSIS — D485 Neoplasm of uncertain behavior of skin: Secondary | ICD-10-CM

## 2021-02-22 DIAGNOSIS — L729 Follicular cyst of the skin and subcutaneous tissue, unspecified: Secondary | ICD-10-CM

## 2021-02-22 DIAGNOSIS — C4491 Basal cell carcinoma of skin, unspecified: Secondary | ICD-10-CM

## 2021-02-22 HISTORY — DX: Basal cell carcinoma of skin, unspecified: C44.91

## 2021-02-22 NOTE — Patient Instructions (Signed)

## 2021-03-03 ENCOUNTER — Telehealth: Payer: Self-pay

## 2021-03-03 NOTE — Telephone Encounter (Signed)
Phone call to patient with his pathology results.  Patient aware.

## 2021-03-03 NOTE — Telephone Encounter (Signed)
-----   Message from Lavonna Monarch, MD sent at 03/03/2021  6:16 AM EDT ----- Schedule surgery with Dr. Darene Lamer

## 2021-03-07 ENCOUNTER — Encounter: Payer: Self-pay | Admitting: Dermatology

## 2021-03-07 NOTE — Progress Notes (Signed)
   Follow-Up Visit   Subjective  [name] is a 80 y.o. male who presents for the following: Skin Problem (Spot on right upper arm x years, sensitive at times, scabs. Patients wants scalp to be checked. ).  Thick crust on right arm, small crusts on scalp and face, general skin check Location:  Duration:  Quality:  Associated Signs/Symptoms: Modifying Factors:  Severity:  Timing: Context:   Objective  Well appearing patient in no apparent distress; mood and affect are within normal limits. Objective  Left Upper Back: Multiple textured flattopped brown 3 to 8 mm papules  Objective  Right Upper Back: Open cyst 5 mm nonfluctuant  Objective  Right Upper Arm - Posterior: Brown 1 cm thick crust, rule out carcinoma     Objective  Mid Frontal Scalp (5), Right Temple: Hornlike pink crusts 2 to 5 mm   All skin waist up examined.   Assessment & Plan    Seborrheic keratosis Left Upper Back  Leave if stable  Cyst of skin Right Upper Back  Intervention not currently necessary  Neoplasm of uncertain behavior of skin Right Upper Arm - Posterior  Skin / nail biopsy Type of biopsy: tangential   Informed consent: discussed and consent obtained   Timeout: patient name, date of birth, surgical site, and procedure verified   Procedure prep:  Patient was prepped and draped in usual sterile fashion (Non sterile) Prep type:  Chlorhexidine Anesthesia: the lesion was anesthetized in a standard fashion   Anesthetic:  1% lidocaine w/ epinephrine 1-100,000 local infiltration Instrument used: flexible razor blade   Outcome: patient tolerated procedure well   Post-procedure details: wound care instructions given    Specimen 1 - Surgical pathology Differential Diagnosis: bcc vs scc  Check Margins: No  AK (actinic keratosis) (6) Mid Frontal Scalp (5); Right Temple  Destruction of lesion - Mid Frontal Scalp, Right Temple Complexity: simple   Destruction method:  cryotherapy   Informed consent: discussed and consent obtained   Timeout:  patient name, date of birth, surgical site, and procedure verified Lesion destroyed using liquid nitrogen: Yes   Cryotherapy cycles:  5 Outcome: patient tolerated procedure well with no complications   Post-procedure details: wound care instructions given    Inflamed seborrheic keratosis (5) Mid Frontal Scalp  Destruction of lesion - Mid Frontal Scalp Complexity: simple   Destruction method: cryotherapy   Informed consent: discussed and consent obtained   Timeout:  patient name, date of birth, surgical site, and procedure verified Lesion destroyed using liquid nitrogen: Yes   Cryotherapy cycles:  5 Outcome: patient tolerated procedure well with no complications   Post-procedure details: wound care instructions given       I, Lavonna Monarch, MD, have reviewed all documentation for this visit.  The documentation on 03/07/21 for the exam, diagnosis, procedures, and orders are all accurate and complete.

## 2021-04-14 ENCOUNTER — Ambulatory Visit (INDEPENDENT_AMBULATORY_CARE_PROVIDER_SITE_OTHER): Payer: Medicare Other | Admitting: Dermatology

## 2021-04-14 ENCOUNTER — Encounter: Payer: Self-pay | Admitting: Dermatology

## 2021-04-14 ENCOUNTER — Other Ambulatory Visit: Payer: Self-pay

## 2021-04-14 DIAGNOSIS — C4491 Basal cell carcinoma of skin, unspecified: Secondary | ICD-10-CM

## 2021-04-14 DIAGNOSIS — C44612 Basal cell carcinoma of skin of right upper limb, including shoulder: Secondary | ICD-10-CM | POA: Diagnosis not present

## 2021-04-14 NOTE — Patient Instructions (Signed)

## 2021-04-29 ENCOUNTER — Encounter: Payer: Self-pay | Admitting: Dermatology

## 2021-04-29 NOTE — Progress Notes (Signed)
   Follow-Up Visit   Subjective  [name] is a 80 y.o. male who presents for the following: Procedure (Bcc right upper arm post).  BCC Location: Right arm Duration:  Quality:  Associated Signs/Symptoms: Modifying Factors:  Severity:  Timing: Context:   Objective  Well appearing patient in no apparent distress; mood and affect are within normal limits. Objective  Right Upper Arm - Posterior: Lesion identified by Dr.Amador Braddy and nurse in room.      A focused examination was performed including Head, neck, arms. Relevant physical exam findings are noted in the Assessment and Plan.   Assessment & Plan    Basal cell carcinoma (BCC), unspecified site Right Upper Arm - Posterior  Destruction of lesion Complexity: simple   Destruction method: electrodesiccation and curettage   Informed consent: discussed and consent obtained   Timeout:  patient name, date of birth, surgical site, and procedure verified Anesthesia: the lesion was anesthetized in a standard fashion   Anesthetic:  1% lidocaine w/ epinephrine 1-100,000 local infiltration Curettage performed in three different directions: Yes   Electrodesiccation performed over the curetted area: Yes   Curettage cycles:  3 Lesion length (cm):  2.5 Lesion width (cm):  2.5 Margin per side (cm):  0 Final wound size (cm):  2.5 Hemostasis achieved with:  ferric subsulfate Outcome: patient tolerated procedure well with no complications   Post-procedure details: sterile dressing applied and wound care instructions given   Additional details:  Wound innoculated with 5 fluorouracil solution.      I, Lavonna Monarch, MD, have reviewed all documentation for this visit.  The documentation on 04/29/21 for the exam, diagnosis, procedures, and orders are all accurate and complete.

## 2021-06-08 DIAGNOSIS — I10 Essential (primary) hypertension: Secondary | ICD-10-CM | POA: Diagnosis not present

## 2021-06-08 DIAGNOSIS — M792 Neuralgia and neuritis, unspecified: Secondary | ICD-10-CM | POA: Diagnosis not present

## 2021-06-08 DIAGNOSIS — R109 Unspecified abdominal pain: Secondary | ICD-10-CM | POA: Diagnosis not present

## 2021-06-08 DIAGNOSIS — I714 Abdominal aortic aneurysm, without rupture: Secondary | ICD-10-CM | POA: Diagnosis not present

## 2021-06-08 DIAGNOSIS — E1169 Type 2 diabetes mellitus with other specified complication: Secondary | ICD-10-CM | POA: Diagnosis not present

## 2021-06-08 DIAGNOSIS — L89151 Pressure ulcer of sacral region, stage 1: Secondary | ICD-10-CM | POA: Diagnosis not present

## 2021-06-08 DIAGNOSIS — Z0001 Encounter for general adult medical examination with abnormal findings: Secondary | ICD-10-CM | POA: Diagnosis not present

## 2021-06-08 DIAGNOSIS — E7800 Pure hypercholesterolemia, unspecified: Secondary | ICD-10-CM | POA: Diagnosis not present

## 2021-06-08 DIAGNOSIS — I2510 Atherosclerotic heart disease of native coronary artery without angina pectoris: Secondary | ICD-10-CM | POA: Diagnosis not present

## 2021-06-13 ENCOUNTER — Other Ambulatory Visit: Payer: Self-pay | Admitting: Family Medicine

## 2021-06-13 DIAGNOSIS — I714 Abdominal aortic aneurysm, without rupture, unspecified: Secondary | ICD-10-CM

## 2021-06-15 ENCOUNTER — Ambulatory Visit
Admission: RE | Admit: 2021-06-15 | Discharge: 2021-06-15 | Disposition: A | Discharge status: Home / Self Care | Payer: Medicare Other | Source: Ambulatory Visit | Attending: Family Medicine | Admitting: Family Medicine

## 2021-06-15 ENCOUNTER — Other Ambulatory Visit: Payer: Self-pay

## 2021-06-15 DIAGNOSIS — I714 Abdominal aortic aneurysm, without rupture, unspecified: Secondary | ICD-10-CM

## 2021-10-04 ENCOUNTER — Encounter (HOSPITAL_BASED_OUTPATIENT_CLINIC_OR_DEPARTMENT_OTHER): Payer: Medicare Other | Attending: Internal Medicine | Admitting: Internal Medicine

## 2021-10-04 ENCOUNTER — Ambulatory Visit: Payer: Medicare Other | Admitting: Dermatology

## 2021-10-04 ENCOUNTER — Other Ambulatory Visit: Payer: Self-pay

## 2021-10-04 DIAGNOSIS — E11622 Type 2 diabetes mellitus with other skin ulcer: Secondary | ICD-10-CM | POA: Diagnosis not present

## 2021-10-04 DIAGNOSIS — L89312 Pressure ulcer of right buttock, stage 2: Secondary | ICD-10-CM | POA: Insufficient documentation

## 2021-10-04 DIAGNOSIS — E11628 Type 2 diabetes mellitus with other skin complications: Secondary | ICD-10-CM | POA: Diagnosis present

## 2021-10-04 DIAGNOSIS — R2689 Other abnormalities of gait and mobility: Secondary | ICD-10-CM | POA: Diagnosis not present

## 2021-10-04 DIAGNOSIS — L98412 Non-pressure chronic ulcer of buttock with fat layer exposed: Secondary | ICD-10-CM | POA: Diagnosis not present

## 2021-10-04 DIAGNOSIS — M47892 Other spondylosis, cervical region: Secondary | ICD-10-CM | POA: Diagnosis not present

## 2021-10-04 NOTE — Progress Notes (Addendum)
LAVELLE, AKEL ([id number]) Visit Report for 10/04/2021 Allergy List Details Patient Name: Date of Service: Alveta Heimlich RE, [place] 10/04/2021 1:15 PM Medical Record Number: [id number] Patient Account Number: [id number] Date of Birth/Sex: Treating RN: [birth year] (80 y.o. [name] Primary Care Kyle Stansell: Dorthy Cooler, [name] Other Clinician: Referring Langdon Crosson: Treating Bellatrix Devonshire/Extender: Otis Brace, [name] Weeks in Treatment: 0 Allergies Active Allergies No Known Allergies Allergy Notes Electronic Signature(s) Signed: 10/04/2021 4:51:17 PM By: Lorrin Jackson Previous Signature: 10/04/2021 1:22:17 PM Version By: Lorrin Jackson Entered By: Lorrin Jackson on 10/04/2021 13:34:19 -------------------------------------------------------------------------------- Arrival Information Details Patient Name: Date of Service: [name], [name] E. 10/04/2021 1:15 PM Medical Record Number: [id number] Patient Account Number: [id number] Date of Birth/Sex: Treating RN: 1941/10/21 (80 y.o. [name] Primary Care Dayle Sherpa: Dorthy Cooler, [name] Other Clinician: Referring Aissa Lisowski: Treating Zuley Lutter/Extender: Otis Brace, [name] Weeks in Treatment: 0 Visit Information Patient Arrived: Wheel Chair Arrival Time: 13:25 Accompanied By: wife Transfer Assistance: Manual Patient Identification Verified: Yes Secondary Verification Process Completed: Yes Patient Requires Transmission-Based Precautions: No Patient Has Alerts: Yes Patient Alerts: Patient on Blood Thinner Electronic Signature(s) Signed: 10/04/2021 4:51:17 PM By: Lorrin Jackson Entered By: Lorrin Jackson on 10/04/2021 13:32:48 -------------------------------------------------------------------------------- Clinic Level of Care Assessment Details Patient Name: Date of Service: MO Jenetta Downer RE, [name] E. 10/04/2021 1:15 PM Medical Record Number: [id number] Patient Account Number: [id number] Date of Birth/Sex: Treating RN: [birth year] (80 y.o. [name] Primary Care Rushawn Capshaw: Dorthy Cooler, [name] Other Clinician: Referring Kalid Ghan: Treating Ameen Mostafa/Extender: Otis Brace, [name] Weeks in Treatment: 0 Clinic Level of Care Assessment Items TOOL 2 Quantity Score X- 1 0 Use when only an EandM is performed on the INITIAL visit ASSESSMENTS - Nursing Assessment / Reassessment X- 1 20 General Physical Exam (combine w/ comprehensive assessment (listed just below) when performed on new pt. evals) X- 1 25 Comprehensive Assessment (HX, ROS, Risk Assessments, Wounds Hx, etc.) ASSESSMENTS - Wound and Skin A ssessment / Reassessment X - Simple Wound Assessment / Reassessment - one wound 1 5 []  - 0 Complex Wound Assessment / Reassessment - multiple wounds []  - 0 Dermatologic / Skin Assessment (not related to wound area) ASSESSMENTS - Ostomy and/or Continence Assessment and Care []  - 0 Incontinence Assessment and Management []  - 0 Ostomy Care Assessment and Management (repouching, etc.) PROCESS - Coordination of Care []  - 0 Simple Patient / Family Education for ongoing care X- 1 20 Complex (extensive) Patient / Family Education for ongoing care X- 1 10 Staff obtains Programmer, systems, Records, T Results / Process Orders est []  - 0 Staff telephones HHA, Nursing Homes / Clarify orders / etc []  - 0 Routine Transfer to another Facility (non-emergent condition) []  - 0 Routine Hospital Admission (non-emergent condition) []  - 0 New Admissions / Biomedical engineer / Ordering NPWT Apligraf, etc. , []  - 0 Emergency Hospital Admission (emergent condition) []  - 0 Simple Discharge Coordination []  - 0 Complex (extensive) Discharge Coordination PROCESS - Special Needs []  - 0 Pediatric / Minor Patient Management []  - 0 Isolation Patient Management []  - 0 Hearing / Language / Visual special needs []  - 0 Assessment of Community assistance (transportation, D/C planning, etc.) []  - 0 Additional assistance / Altered  mentation []  - 0 Support Surface(s) Assessment (bed, cushion, seat, etc.) INTERVENTIONS - Wound Cleansing / Measurement []  - 0 Wound Imaging (photographs - any number of wounds) []  - 0 Wound Tracing (instead of photographs) X- 1 5 Simple Wound Measurement - one wound []  - 0 Complex  Wound Measurement - multiple wounds X- 1 5 Simple Wound Cleansing - one wound []  - 0 Complex Wound Cleansing - multiple wounds INTERVENTIONS - Wound Dressings X - Small Wound Dressing one or multiple wounds 1 10 []  - 0 Medium Wound Dressing one or multiple wounds []  - 0 Large Wound Dressing one or multiple wounds []  - 0 Application of Medications - injection INTERVENTIONS - Miscellaneous []  - 0 External ear exam []  - 0 Specimen Collection (cultures, biopsies, blood, body fluids, etc.) []  - 0 Specimen(s) / Culture(s) sent or taken to Lab for analysis []  - 0 Patient Transfer (multiple staff / Civil Service fast streamer / Similar devices) []  - 0 Simple Staple / Suture removal (25 or less) []  - 0 Complex Staple / Suture removal (26 or more) []  - 0 Hypo / Hyperglycemic Management (close monitor of Blood Glucose) []  - 0 Ankle / Brachial Index (ABI) - do not check if billed separately Has the patient been seen at the hospital within the last three years: Yes Total Score: 100 Level Of Care: New/Established - Level 3 Electronic Signature(s) Signed: 10/04/2021 4:51:17 PM By: Lorrin Jackson Entered By: Lorrin Jackson on 10/04/2021 14:40:43 -------------------------------------------------------------------------------- Encounter Discharge Information Details Patient Name: Date of Service: [name], [name] E. 10/04/2021 1:15 PM Medical Record Number: [id number] Patient Account Number: [id number] Date of Birth/Sex: Treating RN: 07-20-1941 (80 y.o. [name] Primary Care Dominique Calvey: Dorthy Cooler, [name] Other Clinician: Referring Tykesha Konicki: Treating Ignacio Lowder/Extender: Otis Brace, [name] Weeks in Treatment:  0 Encounter Discharge Information Items Discharge Condition: Stable Ambulatory Status: Wheelchair Discharge Destination: Home Transportation: Private Auto Accompanied By: Wife Schedule Follow-up Appointment: Yes Clinical Summary of Care: Provided on 10/04/2021 Form Type Recipient Paper Patient Patient Electronic Signature(s) Signed: 10/04/2021 2:41:42 PM By: Lorrin Jackson Entered By: Lorrin Jackson on 10/04/2021 14:41:41 -------------------------------------------------------------------------------- Lower Extremity Assessment Details Patient Name: Date of Service: [name], [name] E. 10/04/2021 1:15 PM Medical Record Number: [id number] Patient Account Number: [id number] Date of Birth/Sex: Treating RN: [birth year] (80 y.o. [name] Primary Care Anginette Espejo: Dorthy Cooler, [name] Other Clinician: Referring Caron Tardif: Treating Jehan Bonano/Extender: Otis Brace, [name] Weeks in Treatment: 0 Electronic Signature(s) Signed: 10/04/2021 4:51:17 PM By: Lorrin Jackson Entered By: Lorrin Jackson on 10/04/2021 13:42:47 -------------------------------------------------------------------------------- Multi Wound Chart Details Patient Name: Date of Service: [name], [name] E. 10/04/2021 1:15 PM Medical Record Number: [id number] Patient Account Number: [id number] Date of Birth/Sex: Treating RN: 1941/03/21 (80 y.o. Erie Noe Primary Care Kaikoa Magro: Dorthy Cooler, [name] Other Clinician: Referring Markus Casten: Treating Karolee Meloni/Extender: Otis Brace, [name] Weeks in Treatment: 0 Vital Signs Height(in): 71 Pulse(bpm): 68 Weight(lbs): 203 Blood Pressure(mmHg): 168/80 Body Mass Index(BMI): 28 Temperature(F): 97.8 Respiratory Rate(breaths/min): 20 Photos: [1:No Photos Right Gluteus] [N/A:N/A N/A] Wound Location: [1:Pressure Injury] [N/A:N/A] Wounding Event: [1:Pressure Ulcer] [N/A:N/A] Primary Etiology: [1:Cataracts, Coronary Artery Disease, N/A] Comorbid History:  [1:Hypertension, Type II Diabetes, Osteoarthritis 10/06/2019] [N/A:N/A] Date Acquired: [1:0] [N/A:N/A] Weeks of Treatment: [1:Open] [N/A:N/A] Wound Status: [1:0.5x0.3x0.1] [N/A:N/A] Measurements L x W x D (cm) [1:0.118] [N/A:N/A] A (cm) : rea [1:0.012] [N/A:N/A] Volume (cm) : [1:Category/Stage II] [N/A:N/A] Classification: [1:Medium] [N/A:N/A] Exudate A mount: [1:Serosanguineous] [N/A:N/A] Exudate Type: [1:red, brown] [N/A:N/A] Exudate Color: [1:Distinct, outline attached] [N/A:N/A] Wound Margin: [1:Large (67-100%)] [N/A:N/A] Granulation A mount: [1:Red, Pink] [N/A:N/A] Granulation Quality: [1:None Present (0%)] [N/A:N/A] Necrotic A mount: [1:Fat Layer (Subcutaneous Tissue): Yes N/A] Exposed Structures: [1:Fascia: No Tendon: No Muscle: No Joint: No Bone: No None] [N/A:N/A] Treatment Notes Electronic Signature(s) Signed: 10/04/2021 4:39:28 PM By: Linton Ham MD  Signed: 10/04/2021 4:45:49 PM By: Rhae Hammock RN Entered By: Linton Ham on 10/04/2021 14:28:59 -------------------------------------------------------------------------------- Multi-Disciplinary Care Plan Details Patient Name: Date of Service: MO Jenetta Downer RE, [name] E. 10/04/2021 1:15 PM Medical Record Number: [id number] Patient Account Number: [id number] Date of Birth/Sex: Treating RN: [birth year] (80 y.o. [name] Primary Care Sahalie Beth: Dorthy Cooler, [name] Other Clinician: Referring Sharese Manrique: Treating Glendale Youngblood/Extender: Otis Brace, [name] Weeks in Treatment: 0 Active Inactive Electronic Signature(s) Signed: 12/22/2021 5:35:18 PM By: Lorrin Jackson Previous Signature: 10/04/2021 2:39:07 PM Version By: Lorrin Jackson Entered By: Lorrin Jackson on 12/22/2021 17:35:18 -------------------------------------------------------------------------------- Pain Assessment Details Patient Name: Date of Service: MO Jenetta Downer RE, [name] E. 10/04/2021 1:15 PM Medical Record Number: [id number] Patient Account Number:  [id number] Date of Birth/Sex: Treating RN: 1941-02-24 (80 y.o. [name] Primary Care Quantina Dershem: Dorthy Cooler, [name] Other Clinician: Referring Kippy Gohman: Treating Leomia Blake/Extender: Otis Brace, [name] Weeks in Treatment: 0 Active Problems Location of Pain Severity and Description of Pain Patient Has Paino Yes Site Locations Pain Location: Pain in Ulcers With Dressing Change: Yes Duration of the Pain. Constant / Intermittento Intermittent Rate the pain. Current Pain Level: 3 Character of Pain Describe the Pain: Tender Pain Management and Medication Current Pain Management: Medication: Yes Cold Application: No Rest: Yes Massage: No Activity: No T.E.N.S.: No Heat Application: No Leg drop or elevation: No Is the Current Pain Management Adequate: Adequate How does your wound impact your activities of daily livingo Sleep: No Bathing: No Appetite: No Relationship With Others: No Bladder Continence: No Emotions: No Bowel Continence: No Work: No Toileting: No Drive: No Dressing: No Hobbies: No Electronic Signature(s) Signed: 10/04/2021 4:51:17 PM By: Lorrin Jackson Entered By: Lorrin Jackson on 10/04/2021 13:46:41 -------------------------------------------------------------------------------- Patient/Caregiver Education Details Patient Name: Date of Service: MO Jenetta Downer RE, [name] E. 11/8/2022andnbsp1:15 PM Medical Record Number: [id number] Patient Account Number: [id number] Date of Birth/Gender: Treating RN: 12-12-1940 (80 y.o. [name] Primary Care Physician: Dorthy Cooler, [name] Other Clinician: Referring Physician: Treating Physician/Extender: Otis Brace, [name] Weeks in Treatment: 0 Education Assessment Education Provided To: Patient and Caregiver Education Topics Provided Pressure: Methods: Explain/Verbal, Printed Responses: State content correctly Wound/Skin Impairment: Methods: Demonstration, Explain/Verbal, Printed Responses:  State content correctly Electronic Signature(s) Signed: 10/04/2021 4:51:17 PM By: Lorrin Jackson Entered By: Lorrin Jackson on 10/04/2021 14:39:46 -------------------------------------------------------------------------------- Wound Assessment Details Patient Name: Date of Service: MO Jenetta Downer RE, [name] E. 10/04/2021 1:15 PM Medical Record Number: [id number] Patient Account Number: [id number] Date of Birth/Sex: Treating RN: 1940/12/16 (80 y.o. [name] Primary Care Keilen Kahl: Dorthy Cooler, [name] Other Clinician: Referring Fadel Clason: Treating Shaleta Ruacho/Extender: Otis Brace, [name] Weeks in Treatment: 0 Wound Status Wound Number: 1 Primary Pressure Ulcer Etiology: Wound Location: Right Gluteus Wound Open Wounding Event: Pressure Injury Status: Date Acquired: 10/06/2019 Comorbid Cataracts, Coronary Artery Disease, Hypertension, Type II Weeks Of Treatment: 0 History: Diabetes, Osteoarthritis Clustered Wound: No Wound Measurements Length: (cm) 0.5 Width: (cm) 0.3 Depth: (cm) 0.1 Area: (cm) 0.118 Volume: (cm) 0.012 % Reduction in Area: % Reduction in Volume: Epithelialization: None Tunneling: No Undermining: No Wound Description Classification: Category/Stage II Wound Margin: Distinct, outline attached Exudate Amount: Medium Exudate Type: Serosanguineous Exudate Color: red, brown Foul Odor After Cleansing: No Slough/Fibrino No Wound Bed Granulation Amount: Large (67-100%) Exposed Structure Granulation Quality: Red, Pink Fascia Exposed: No Necrotic Amount: None Present (0%) Fat Layer (Subcutaneous Tissue) Exposed: Yes Tendon Exposed: No Muscle Exposed: No Joint Exposed: No Bone Exposed: No Electronic Signature(s) Signed: 10/04/2021 4:51:17 PM By: Lorrin Jackson Entered By: Lorrin Jackson on  10/04/2021 14:12:19 -------------------------------------------------------------------------------- Vitals Details Patient Name: Date of Service: MO Jenetta Downer RE, [place]  10/04/2021 1:15 PM Medical Record Number: [id number] Patient Account Number: [id number] Date of Birth/Sex: Treating RN: 07/19/1941 (80 y.o. [name] Primary Care Solaris Kram: Dorthy Cooler, [name] Other Clinician: Referring Colleen Donahoe: Treating Arlanda Shiplett/Extender: Otis Brace, [name] Weeks in Treatment: 0 Vital Signs Time Taken: 13:33 Temperature (F): 97.8 Height (in): 71 Pulse (bpm): 68 Source: Stated Respiratory Rate (breaths/min): 20 Weight (lbs): 203 Blood Pressure (mmHg): 168/80 Source: Stated Reference Range: 80 - 120 mg / dl Body Mass Index (BMI): 28.3 Electronic Signature(s) Signed: 10/04/2021 4:51:17 PM By: Lorrin Jackson Entered By: Lorrin Jackson on 10/04/2021 13:33:59

## 2021-10-04 NOTE — Progress Notes (Signed)
[name] ([id number]) Visit Report for 10/04/2021 Chief Complaint Document Details Patient Name: Date of Service: [name] [name], [place] 10/04/2021 1:15 PM Medical Record Number: [id number] Patient Account Number: [id number] Date of Birth/Sex: Treating RN: 1941-08-05 (80 y.o. [name] Primary Care Provider: Dorthy Cooler, [name] Other Clinician: Referring Provider: Treating Provider/Extender: Otis Brace, [name] Weeks in Treatment: 0 Information Obtained from: Patient Chief Complaint 10/04/2021; patient is here for a superficial pressure ulcer on his right buttock Electronic Signature(s) Signed: 10/04/2021 4:39:28 PM By: Linton Ham MD Entered By: Linton Ham on 10/04/2021 14:29:34 -------------------------------------------------------------------------------- HPI Details Patient Name: Date of Service: [name], [name]. 10/04/2021 1:15 PM Medical Record Number: [id number] Patient Account Number: [id number] Date of Birth/Sex: Treating RN: Jul 20, 1941 (80 y.o. [name] Primary Care Provider: Dorthy Cooler, [name] Other Clinician: Referring Provider: Treating Provider/Extender: Otis Brace, [name] Weeks in Treatment: 0 History of Present Illness HPI Description: ADMISSION 10/04/2021 This is an 80 year old man who has a history of a recurrent wound on his right buttock. He is cared for at home by family members. He is referred here by [facility] physicians. Both areas and mirror image surrounding the mid part of the gluteal cleft have dry scaling nonblanchable skin cyst highly suggestive of excess pressure. He had a eschared area on the right buttock in this area I used a #3 curette to remove this there is still an open wound here which is a stage II pressure ulcer. 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 Uses a walker otherwise he will fall. He sits on a recliner for most of the day but sleeps in a bed.  Recently she has been using Triderma cream on the erythematous areas which [place] and she thinks that will help the skin in this area more than anything Past medical history includes basal cell CA, seborrheic dermatitis, type 2 diabetes, abdominal aortic aneurysm and TIA Electronic Signature(s) Signed: 10/04/2021 4:39:28 PM By: Linton Ham MD Entered By: Linton Ham on 10/04/2021 14:33:00 -------------------------------------------------------------------------------- Physical Exam Details Patient Name: Date of Service: [name], [name]. 10/04/2021 1:15 PM Medical Record Number: [id number] Patient Account Number: [id number] Date of Birth/Sex: Treating RN: [birth year] (80 y.o. [name] Primary Care Provider: Dorthy Cooler, [name] Other Clinician: Referring Provider: Treating Provider/Extender: Otis Brace, [name] Weeks in Treatment: 0 Constitutional Patient is hypertensive.. Pulse regular and within target range for patient.Marland Kitchen Respirations regular, non-labored and within target range.. Temperature is normal and within the target range for the patient.Marland Kitchen Appears in no distress. Neck Neck supple and symmetrical. No masses or crepitus. Notes Wound exam; small area on the right buttock eschared I removed this there is still a small open wound here. Surrounding minimally blanchable erythema which are probably pressure areas. There is no palpable tenderness no evidence of infection Electronic Signature(s) Signed: 10/04/2021 4:39:28 PM By: Linton Ham MD Entered By: Linton Ham on 10/04/2021 14:32:10 -------------------------------------------------------------------------------- Physician Orders Details Patient Name: Date of Service: [name], [name]. 10/04/2021 1:15 PM Medical Record Number: [id number] Patient Account Number: [id number] Date of Birth/Sex: Treating RN: Aug 06, 1941 (80 y.o. [name] Primary Care Provider: Dorthy Cooler, [name] Other  Clinician: Referring Provider: Treating Provider/Extender: Otis Brace, [name] Weeks in Treatment: 0 Verbal / Phone Orders: No Diagnosis Coding Follow-up Appointments ppointment in 1 week. - with Dr. Dellia Nims Return A Bathing/ Shower/ Hygiene May shower and wash wound with soap and water. -  when changing dressing Off-Loading Other: - Keep pressure off area as much as possible. Non Wound Condition pply the following to affected area as directed: - Apply moisturizing lotion to legs A Wound Treatment Wound #1 - Gluteus Wound Laterality: Right Cleanser: Soap and Water Every Other Day/15 Days Discharge Instructions: May shower and wash wound with dial antibacterial soap and water prior to dressing change. Prim Dressing: PolyMem Non-Adhesive Dressing, 4x4 in Every Other Day/15 Days ary Discharge Instructions: Apply to wound bed as instructed Secondary Dressing: Woven Gauze Sponge, Non-Sterile 4x4 in Every Other Day/15 Days Discharge Instructions: Apply over primary dressing as directed. Secured With: 97M Medipore H Soft Cloth Surgical T ape, 4 x 10 (in/yd) Every Other Day/15 Days Discharge Instructions: Secure with tape as directed. Electronic Signature(s) Signed: 10/04/2021 4:39:28 PM By: Linton Ham MD Signed: 10/04/2021 4:51:17 PM By: Lorrin Jackson Entered By: Lorrin Jackson on 10/04/2021 14:16:46 -------------------------------------------------------------------------------- Problem List Details Patient Name: Date of Service: [name], [name]. 10/04/2021 1:15 PM Medical Record Number: [id number] Patient Account Number: [id number] Date of Birth/Sex: Treating RN: 1941-09-30 (80 y.o. [name] Primary Care Provider: Dorthy Cooler, [name] Other Clinician: Referring Provider: Treating Provider/Extender: Otis Brace, [name] Weeks in Treatment: 0 Active Problems ICD-10 Encounter Code Description Active Date MDM Diagnosis L89.312 Pressure ulcer of right  buttock, stage 2 10/04/2021 No Yes M47.892 Other spondylosis, cervical region 10/04/2021 No Yes R26.89 Other abnormalities of gait and mobility 10/04/2021 No Yes Inactive Problems Resolved Problems Electronic Signature(s) Signed: 10/04/2021 4:39:28 PM By: Linton Ham MD Entered By: Linton Ham on 10/04/2021 14:28:49 -------------------------------------------------------------------------------- Progress Note Details Patient Name: Date of Service: [name], [name]. 10/04/2021 1:15 PM Medical Record Number: [id number] Patient Account Number: [id number] Date of Birth/Sex: Treating RN: 03-10-1941 (80 y.o. [name] Primary Care Provider: Dorthy Cooler, [name] Other Clinician: Referring Provider: Treating Provider/Extender: Otis Brace, [name] Weeks in Treatment: 0 Subjective Chief Complaint Information obtained from Patient 10/04/2021; patient is here for a superficial pressure ulcer on his right buttock History of Present Illness (HPI) ADMISSION 10/04/2021 This is an 80 year old man who has a history of a recurrent wound on his right buttock. He is cared for at home by family members. He is referred here by [facility] physicians. Both areas and mirror image surrounding the mid part of the gluteal cleft have dry scaling nonblanchable skin cyst highly suggestive of excess pressure. He had a eschared area on the right buttock in this area I used a #3 curette to remove this there is still an open wound here which is a stage II pressure ulcer. 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 Uses a walker otherwise he will fall. He sits on a recliner for most of the day but sleeps in a bed. Recently she has been using Triderma cream on the erythematous areas which [place] and she thinks that will help the skin in this area more than anything Past medical history includes basal cell CA, seborrheic dermatitis, type 2 diabetes,  abdominal aortic aneurysm and TIA Patient History Information obtained from Patient. Allergies No Known Allergies Family History Cancer - Siblings, Hypertension - Siblings, No family history of Diabetes, Heart Disease, Hereditary Spherocytosis, Kidney Disease, Lung Disease, Seizures, Stroke, Thyroid Problems, Tuberculosis. Social History Former smoker, Marital Status - Married, Alcohol Use - Never, Drug Use - No History, Caffeine Use - Daily. Medical History Eyes Patient has history of Cataracts - had surgery Cardiovascular  Patient has history of Coronary Artery Disease, Hypertension Endocrine Patient has history of Type II Diabetes Musculoskeletal Patient has history of Osteoarthritis Patient is treated with Oral Agents. Blood sugar is not tested. Medical A Surgical History Notes nd Cardiovascular AAA Oncologic Skin Cancer Right Arm Review of Systems (ROS) Ear/Nose/Mouth/Throat Denies complaints or symptoms of Chronic sinus problems or rhinitis. Respiratory Complains or has symptoms of Shortness of Breath. Gastrointestinal Denies complaints or symptoms of Frequent diarrhea, Nausea, Vomiting. Genitourinary Denies complaints or symptoms of Frequent urination. Integumentary (Skin) Complains or has symptoms of Wounds. Neurologic Denies complaints or symptoms of Numbness/parasthesias. Psychiatric Denies complaints or symptoms of Claustrophobia, Suicidal. Objective Constitutional Patient is hypertensive.. Pulse regular and within target range for patient.Marland Kitchen Respirations regular, non-labored and within target range.. Temperature is normal and within the target range for the patient.Marland Kitchen Appears in no distress. Vitals Time Taken: 1:33 PM, Height: 71 in, Source: Stated, Weight: 203 lbs, Source: Stated, BMI: 28.3, Temperature: 97.8 F, Pulse: 68 bpm, Respiratory Rate: 20 breaths/min, Blood Pressure: 168/80 mmHg. Neck Neck supple and symmetrical. No masses or crepitus. General  Notes: Wound exam; small area on the right buttock eschared I removed this there is still a small open wound here. Surrounding minimally blanchable erythema which are probably pressure areas. There is no palpable tenderness no evidence of infection Integumentary (Hair, Skin) Wound #1 status is Open. Original cause of wound was Pressure Injury. The date acquired was: 10/06/2019. The wound is located on the Right Gluteus. The wound measures 0.5cm length x 0.3cm width x 0.1cm depth; 0.118cm^2 area and 0.012cm^3 volume. There is Fat Layer (Subcutaneous Tissue) exposed. There is no tunneling or undermining noted. There is a medium amount of serosanguineous drainage noted. The wound margin is distinct with the outline attached to the wound base. There is large (67-100%) red, pink granulation within the wound bed. There is no necrotic tissue within the wound bed. Assessment Active Problems ICD-10 Pressure ulcer of right buttock, stage 2 Other spondylosis, cervical region Other abnormalities of gait and mobility Plan Follow-up Appointments: Return Appointment in 1 week. - with Dr. Arcola Jansky Shower/ Hygiene: May shower and wash wound with soap and water. - when changing dressing Off-Loading: Other: - Keep pressure off area as much as possible. Non Wound Condition: Apply the following to affected area as directed: - Apply moisturizing lotion to legs WOUND #1: - Gluteus Wound Laterality: Right Cleanser: Soap and Water Every Other Day/15 Days Discharge Instructions: May shower and wash wound with dial antibacterial soap and water prior to dressing change. Prim Dressing: PolyMem Non-Adhesive Dressing, 4x4 in Every Other Day/15 Days ary Discharge Instructions: Apply to wound bed as instructed Secondary Dressing: Woven Gauze Sponge, Non-Sterile 4x4 in Every Other Day/15 Days Discharge Instructions: Apply over primary dressing as directed. Secured With: 30M Medipore H Soft Cloth Surgical T ape, 4 x  10 (in/yd) Every Other Day/15 Days Discharge Instructions: Secure with tape as directed. 1. We are going to use regular polymen over the wounded area with a goal of changing this every second day 2. I talked to the patient and his family about offloading this area. Currently he spends most of the day sitting in the same position in the recliner watching television week I told them to use anything at their disposal to try and offload these areas. Although his wound is small this is actually a situation that potentially for tells more difficult problems 3. The patient has what sounds like cervical spondylosis he had surgery and was  left with gait ataxia so he is not very mobile. Electronic Signature(s) Signed: 10/04/2021 4:39:28 PM By: Linton Ham MD Entered By: Linton Ham on 10/04/2021 14:34:32 -------------------------------------------------------------------------------- HxROS Details Patient Name: Date of Service: [name], [name]. 10/04/2021 1:15 PM Medical Record Number: [id number] Patient Account Number: [id number] Date of Birth/Sex: Treating RN: November 12, 1941 (80 y.o. [name] Primary Care Provider: Dorthy Cooler, [name] Other Clinician: Referring Provider: Treating Provider/Extender: Otis Brace, [name] Weeks in Treatment: 0 Information Obtained From Patient Ear/Nose/Mouth/Throat Complaints and Symptoms: Negative for: Chronic sinus problems or rhinitis Respiratory Complaints and Symptoms: Positive for: Shortness of Breath Gastrointestinal Complaints and Symptoms: Negative for: Frequent diarrhea; Nausea; Vomiting Genitourinary Complaints and Symptoms: Negative for: Frequent urination Integumentary (Skin) Complaints and Symptoms: Positive for: Wounds Neurologic Complaints and Symptoms: Negative for: Numbness/parasthesias Psychiatric Complaints and Symptoms: Negative for: Claustrophobia; Suicidal Eyes Medical History: Positive for: Cataracts - had  surgery Hematologic/Lymphatic Cardiovascular Medical History: Positive for: Coronary Artery Disease; Hypertension Past Medical History Notes: AAA Endocrine Medical History: Positive for: Type II Diabetes Time with diabetes: 30 years Treated with: Oral agents Blood sugar tested every day: No Immunological Musculoskeletal Medical History: Positive for: Osteoarthritis Oncologic Medical History: Past Medical History Notes: Skin Cancer Right Arm HBO Extended History Items Eyes: Cataracts Immunizations Pneumococcal Vaccine: Received Pneumococcal Vaccination: Yes Received Pneumococcal Vaccination On or After 60th Birthday: Yes Implantable Devices None Family and Social History Cancer: Yes - Siblings; Diabetes: No; Heart Disease: No; Hereditary Spherocytosis: No; Hypertension: Yes - Siblings; Kidney Disease: No; Lung Disease: No; Seizures: No; Stroke: No; Thyroid Problems: No; Tuberculosis: No; Former smoker; Marital Status - Married; Alcohol Use: Never; Drug Use: No History; Caffeine Use: Daily; Financial Concerns: No; Food, Clothing or Shelter Needs: No; Support System Lacking: No; Transportation Concerns: No Electronic Signature(s) Signed: 10/04/2021 4:39:28 PM By: Linton Ham MD Signed: 10/04/2021 4:51:17 PM By: Lorrin Jackson Entered By: Lorrin Jackson on 10/04/2021 13:39:41 -------------------------------------------------------------------------------- [place] Details Patient Name: Date of Service: [name], [name]. 10/04/2021 Medical Record Number: [id number] Patient Account Number: [id number] Date of Birth/Sex: Treating RN: August 30, 1941 (80 y.o. [name] Primary Care Provider: Dorthy Cooler, [name] Other Clinician: Referring Provider: Treating Provider/Extender: Otis Brace, [name] Weeks in Treatment: 0 Diagnosis Coding ICD-10 Codes Code Description 8208563284 Pressure ulcer of right buttock, stage 2 M47.892 Other spondylosis, cervical  region R26.89 Other abnormalities of gait and mobility Facility Procedures CPT4 Code: 86767209 Description: 99213 - WOUND CARE VISIT-LEV 3 EST PT Modifier: 25 Quantity: 1 Physician Procedures : CPT4 Code Description Modifier [id number] - WC PHYS LEVEL 2 - NEW PT ICD-10 Diagnosis Description U76.546 Pressure ulcer of right buttock, stage 2 M47.892 Other spondylosis, cervical region R26.89 Other abnormalities of gait and mobility Quantity: 1 Electronic Signature(s) Signed: 10/04/2021 2:40:54 PM By: Lorrin Jackson Signed: 10/04/2021 4:39:28 PM By: Linton Ham MD Entered By: Lorrin Jackson on 10/04/2021 14:40:53

## 2021-10-04 NOTE — Progress Notes (Signed)
[name], [name] ([id number]) Visit Report for 10/04/2021 Abuse/Suicide Risk Screen Details Patient Name: Date of Service: Alveta Heimlich RE, [place] 10/04/2021 1:15 PM Medical Record Number: [id number] Patient Account Number: [id number] Date of Birth/Sex: Treating RN: March 26, 1941 (80 y.o. [name] Primary Care Jagger Demonte: Dorthy Cooler, [name] Other Clinician: Referring Shaylene Paganelli: Treating Ardyth Kelso/Extender: Otis Brace, [name] Weeks in Treatment: 0 Abuse/Suicide Risk Screen Items Answer ABUSE RISK SCREEN: Has anyone close to you tried to hurt or harm you recentlyo No Do you feel uncomfortable with anyone in your familyo No Has anyone forced you do things that you didnt want to doo No Electronic Signature(s) Signed: 10/04/2021 4:51:17 PM By: Lorrin Jackson Entered By: Lorrin Jackson on 10/04/2021 13:39:54 -------------------------------------------------------------------------------- Activities of Daily Living Details Patient Name: Date of Service: Alveta Heimlich RE, [place] X E. 10/04/2021 1:15 PM Medical Record Number: [id number] Patient Account Number: [id number] Date of Birth/Sex: Treating RN: [birth year] (80 y.o. [name] Primary Care Cirilo Canner: Dorthy Cooler, [name] Other Clinician: Referring Lady Wisham: Treating Ashleigh Arya/Extender: Otis Brace, [name] Weeks in Treatment: 0 Activities of Daily Living Items Answer Activities of Daily Living (Please select one for each item) Drive Automobile Not Able T Medications ake Completely Able Use T elephone Completely Able Care for Appearance Need Assistance Use T oilet Completely Able Bath / Shower Need Assistance Dress Self Need Assistance Feed Self Completely Able Walk Need Assistance Get In / Out Bed Need Assistance Housework Need Assistance Prepare Meals Need Assistance Handle Money Completely Able Shop for Self Need Assistance Electronic Signature(s) Signed: 10/04/2021 4:51:17 PM By: Lorrin Jackson Entered By: Lorrin Jackson on  10/04/2021 13:40:57 -------------------------------------------------------------------------------- Education Screening Details Patient Name: Date of Service: [name], [name] E. 10/04/2021 1:15 PM Medical Record Number: [id number] Patient Account Number: [id number] Date of Birth/Sex: Treating RN: 02-11-1941 (80 y.o. [name] Primary Care Clerance Umland: Dorthy Cooler, [name] Other Clinician: Referring Amanda Pote: Treating Naz Denunzio/Extender: Otis Brace, [name] Weeks in Treatment: 0 Primary Learner Assessed: Patient Learning Preferences/Education Level/Primary Language Learning Preference: Explanation, Demonstration, Printed Material Highest Education Level: College or Above Preferred Language: English Cognitive Barrier Language Barrier: No Translator Needed: No Memory Deficit: No Emotional Barrier: No Cultural/Religious Beliefs Affecting Medical Care: No Physical Barrier Impaired Vision: No Impaired Hearing: No Decreased Hand dexterity: No Knowledge/Comprehension Knowledge Level: High Comprehension Level: High Ability to understand written instructions: High Ability to understand verbal instructions: High Motivation Anxiety Level: Calm Cooperation: Cooperative Education Importance: Acknowledges Need Interest in Health Problems: Asks Questions Perception: Coherent Willingness to Engage in Self-Management High Activities: Readiness to Engage in Self-Management High Activities: Electronic Signature(s) Signed: 10/04/2021 4:51:17 PM By: Lorrin Jackson Entered By: Lorrin Jackson on 10/04/2021 13:41:32 -------------------------------------------------------------------------------- Fall Risk Assessment Details Patient Name: Date of Service: [name], [name] E. 10/04/2021 1:15 PM Medical Record Number: [id number] Patient Account Number: [id number] Date of Birth/Sex: Treating RN: May 04, 1941 (80 y.o. [name] Primary Care Laqueta Bonaventura: Dorthy Cooler, [name] Other  Clinician: Referring Reva Pinkley: Treating Gisele Pack/Extender: Otis Brace, [name] Weeks in Treatment: 0 Fall Risk Assessment Items Have you had 2 or more falls in the last 12 monthso 0 Yes Have you had any fall that resulted in injury in the last 12 monthso 0 No FALLS RISK SCREEN History of falling - immediate or within 3 months 25 Yes Secondary diagnosis (Do you have 2 or more medical diagnoseso) 15 Yes Ambulatory aid None/bed rest/wheelchair/nurse 0 No Crutches/cane/walker 15 Yes Furniture 0 No Intravenous therapy Access/Saline/Heparin Lock 0 No Gait/Transferring Normal/ bed rest/ wheelchair 0 No Weak (  short steps with or without shuffle, stooped but able to lift head while walking, may seek 10 Yes support from furniture) Impaired (short steps with shuffle, may have difficulty arising from chair, head down, impaired 0 No balance) Mental Status Oriented to own ability 0 Yes Electronic Signature(s) Signed: 10/04/2021 4:51:17 PM By: Lorrin Jackson Entered By: Lorrin Jackson on 10/04/2021 13:42:07 -------------------------------------------------------------------------------- Foot Assessment Details Patient Name: Date of Service: [name], [name] E. 10/04/2021 1:15 PM Medical Record Number: [id number] Patient Account Number: [id number] Date of Birth/Sex: Treating RN: 02-14-1941 (80 y.o. [name] Primary Care Karimah Winquist: Dorthy Cooler, [name] Other Clinician: Referring Darel Ricketts: Treating Yuma Pacella/Extender: Otis Brace, [name] Weeks in Treatment: 0 Foot Assessment Items Site Locations + = Sensation present, - = Sensation absent, C = Callus, U = Ulcer R = Redness, W = Warmth, M = Maceration, PU = Pre-ulcerative lesion F = Fissure, S = Swelling, D = Dryness Assessment Right: Left: Other Deformity: No No Prior Foot Ulcer: No No Prior Amputation: No No Charcot Joint: No No Ambulatory Status: Gait: Notes N/A : Sacral wound Electronic Signature(s) Signed:  10/04/2021 4:51:17 PM By: Lorrin Jackson Entered By: Lorrin Jackson on 10/04/2021 13:42:40 -------------------------------------------------------------------------------- Nutrition Risk Screening Details Patient Name: Date of Service: MO Jenetta Downer RE, [name] E. 10/04/2021 1:15 PM Medical Record Number: [id number] Patient Account Number: [id number] Date of Birth/Sex: Treating RN: [birth year] (80 y.o. [name] Primary Care Juliett Eastburn: Dorthy Cooler, [name] Other Clinician: Referring Carel Carrier: Treating Joneisha Miles/Extender: Otis Brace, [name] Weeks in Treatment: 0 Height (in): 71 Weight (lbs): 203 Body Mass Index (BMI): 28.3 Nutrition Risk Screening Items Score Screening NUTRITION RISK SCREEN: I have an illness or condition that made me change the kind and/or amount of food I eat 0 No I eat fewer than two meals per day 0 No I eat few fruits and vegetables, or milk products 0 No I have three or more drinks of beer, liquor or wine almost every day 0 No I have tooth or mouth problems that make it hard for me to eat 0 No I don't always have enough money to buy the food I need 0 No I eat alone most of the time 0 No I take three or more different prescribed or over-the-counter drugs a day 1 Yes Without wanting to, I have lost or gained 10 pounds in the last six months 0 No I am not always physically able to shop, cook and/or feed myself 0 No Nutrition Protocols Good Risk Protocol 0 No interventions needed Moderate Risk Protocol High Risk Proctocol Risk Level: Good Risk Score: 1 Electronic Signature(s) Signed: 10/04/2021 4:51:17 PM By: Lorrin Jackson Entered By: Lorrin Jackson on 10/04/2021 13:42:22

## 2021-10-11 ENCOUNTER — Encounter (HOSPITAL_BASED_OUTPATIENT_CLINIC_OR_DEPARTMENT_OTHER): Payer: Medicare Other | Admitting: Internal Medicine

## 2021-10-12 DIAGNOSIS — J988 Other specified respiratory disorders: Secondary | ICD-10-CM | POA: Diagnosis not present

## 2021-10-12 DIAGNOSIS — I10 Essential (primary) hypertension: Secondary | ICD-10-CM | POA: Diagnosis not present

## 2021-11-01 ENCOUNTER — Encounter (HOSPITAL_BASED_OUTPATIENT_CLINIC_OR_DEPARTMENT_OTHER): Payer: Medicare Other | Admitting: Internal Medicine

## 2021-11-15 DIAGNOSIS — J069 Acute upper respiratory infection, unspecified: Secondary | ICD-10-CM | POA: Diagnosis not present

## 2021-11-15 DIAGNOSIS — Z03818 Encounter for observation for suspected exposure to other biological agents ruled out: Secondary | ICD-10-CM | POA: Diagnosis not present

## 2021-12-13 DIAGNOSIS — J029 Acute pharyngitis, unspecified: Secondary | ICD-10-CM | POA: Diagnosis not present

## 2021-12-13 DIAGNOSIS — Z20822 Contact with and (suspected) exposure to covid-19: Secondary | ICD-10-CM | POA: Diagnosis not present

## 2021-12-13 DIAGNOSIS — R5383 Other fatigue: Secondary | ICD-10-CM | POA: Diagnosis not present

## 2021-12-13 DIAGNOSIS — R051 Acute cough: Secondary | ICD-10-CM | POA: Diagnosis not present

## 2021-12-13 DIAGNOSIS — R0981 Nasal congestion: Secondary | ICD-10-CM | POA: Diagnosis not present

## 2021-12-13 DIAGNOSIS — J069 Acute upper respiratory infection, unspecified: Secondary | ICD-10-CM | POA: Diagnosis not present

## 2022-07-04 ENCOUNTER — Encounter (HOSPITAL_BASED_OUTPATIENT_CLINIC_OR_DEPARTMENT_OTHER): Payer: Medicare Other | Admitting: Internal Medicine

## 2022-07-06 ENCOUNTER — Encounter (HOSPITAL_BASED_OUTPATIENT_CLINIC_OR_DEPARTMENT_OTHER): Payer: Medicare Other | Attending: Internal Medicine | Admitting: Internal Medicine

## 2022-07-06 DIAGNOSIS — Z8673 Personal history of transient ischemic attack (TIA), and cerebral infarction without residual deficits: Secondary | ICD-10-CM | POA: Diagnosis not present

## 2022-07-06 DIAGNOSIS — Z85828 Personal history of other malignant neoplasm of skin: Secondary | ICD-10-CM | POA: Diagnosis not present

## 2022-07-06 DIAGNOSIS — Z87891 Personal history of nicotine dependence: Secondary | ICD-10-CM | POA: Diagnosis not present

## 2022-07-06 DIAGNOSIS — Z8679 Personal history of other diseases of the circulatory system: Secondary | ICD-10-CM | POA: Diagnosis not present

## 2022-07-06 DIAGNOSIS — L89322 Pressure ulcer of left buttock, stage 2: Secondary | ICD-10-CM | POA: Diagnosis not present

## 2022-07-06 DIAGNOSIS — L89312 Pressure ulcer of right buttock, stage 2: Secondary | ICD-10-CM | POA: Insufficient documentation

## 2022-07-06 DIAGNOSIS — E11622 Type 2 diabetes mellitus with other skin ulcer: Secondary | ICD-10-CM | POA: Insufficient documentation

## 2022-07-06 NOTE — Progress Notes (Signed)
MATTHEWJAMES, PETRASEK ([id number]) Visit Report for 07/06/2022 Abuse Risk Screen Details Patient Name: Date of Service: MO Jenetta Downer RE, [place] X E. 07/06/2022 8:00 A M Medical Record Number: [id number] Patient Account Number: [id number] Date of Birth/Sex: Treating RN: 05-Apr-1941 (81 y.o. Erie Noe Primary Care Bryonna Sundby: Dorthy Cooler, [name] Other Clinician: Referring Shariff Lasky: Treating Emmerie Battaglia/Extender: Alanda Slim, [name] Weeks in Treatment: 0 Abuse Risk Screen Items Answer ABUSE RISK SCREEN: Has anyone close to you tried to hurt or harm you recentlyo No Do you feel uncomfortable with anyone in your familyo No Has anyone forced you do things that you didnt want to doo No Electronic Signature(s) Signed: 07/06/2022 5:01:19 PM By: Rhae Hammock RN Entered By: Rhae Hammock on 07/06/2022 08:25:19 -------------------------------------------------------------------------------- Activities of Daily Living Details Patient Name: Date of Service: [name], [name] E. 07/06/2022 8:00 A M Medical Record Number: [id number] Patient Account Number: [id number] Date of Birth/Sex: Treating RN: 06/19/1941 (81 y.o. Erie Noe Primary Care Richel Millspaugh: Dorthy Cooler, [name] Other Clinician: Referring Natilie Krabbenhoft: Treating Jovoni Borkenhagen/Extender: Alanda Slim, [name] Weeks in Treatment: 0 Activities of Daily Living Items Answer Activities of Daily Living (Please select one for each item) Drive Automobile Not Able T Medications ake Need Assistance Use T elephone Need Assistance Care for Appearance Need Assistance Use T oilet Need Assistance Bath / Shower Need Assistance Dress Self Need Assistance Feed Self Need Assistance Walk Need Assistance Get In / Out Bed Need Assistance Housework Need Assistance Prepare Meals Need Assistance Handle Money Need Assistance Shop for Self Need Assistance Electronic Signature(s) Signed: 07/06/2022 5:01:19 PM By: Rhae Hammock RN Entered By: Rhae Hammock on 07/06/2022 08:27:58 -------------------------------------------------------------------------------- Education Screening Details Patient Name: Date of Service: [name], [name] E. 07/06/2022 8:00 A M Medical Record Number: [id number] Patient Account Number: [id number] Date of Birth/Sex: Treating RN: 1941/06/21 (81 y.o. Erie Noe Primary Care Giannie Soliday: Dorthy Cooler, [name] Other Clinician: Referring Stephaniemarie Stoffel: Treating Clotile Whittington/Extender: Alanda Slim, [name] Weeks in Treatment: 0 Primary Learner Assessed: Patient Learning Preferences/Education Level/Primary Language Learning Preference: Explanation, Demonstration, Communication Board, Printed Material Highest Education Level: College or Above Preferred Language: English Cognitive Barrier Language Barrier: No Translator Needed: No Memory Deficit: No Emotional Barrier: No Cultural/Religious Beliefs Affecting Medical Care: No Physical Barrier Impaired Vision: No Impaired Hearing: No Decreased Hand dexterity: No Knowledge/Comprehension Knowledge Level: High Comprehension Level: High Ability to understand written instructions: High Ability to understand verbal instructions: High Motivation Anxiety Level: Calm Cooperation: Cooperative Education Importance: Denies Need Interest in Health Problems: Asks Questions Perception: Coherent Willingness to Engage in Self-Management High Activities: Readiness to Engage in Self-Management High Activities: Electronic Signature(s) Signed: 07/06/2022 5:01:19 PM By: Rhae Hammock RN Entered By: Rhae Hammock on 07/06/2022 08:28:33 -------------------------------------------------------------------------------- Fall Risk Assessment Details Patient Name: Date of Service: [name], [name] E. 07/06/2022 8:00 A M Medical Record Number: [id number] Patient Account Number: [id number] Date of Birth/Sex: Treating RN: 09/24/1941 (81 y.o. Burnadette Pop, [place] Primary Care  Cionna Collantes: Dorthy Cooler, [name] Other Clinician: Referring Emily Forse: Treating Drexel Ivey/Extender: Alanda Slim, [name] Weeks in Treatment: 0 Fall Risk Assessment Items Have you had 2 or more falls in the last 12 monthso 0 No Have you had any fall that resulted in injury in the last 12 monthso 0 No FALLS RISK SCREEN History of falling - immediate or within 3 months 0 No Secondary diagnosis (Do you have 2 or more medical diagnoseso) 0 No Ambulatory aid None/bed rest/wheelchair/nurse 0 No Crutches/cane/walker 0 No Furniture 0 No Intravenous therapy Access/Saline/Heparin Lock 0  No Gait/Transferring Normal/ bed rest/ wheelchair 0 No Weak (short steps with or without shuffle, stooped but able to lift head while walking, may seek 0 No support from furniture) Impaired (short steps with shuffle, may have difficulty arising from chair, head down, impaired 0 No balance) Mental Status Oriented to own ability 0 No Electronic Signature(s) Signed: 07/06/2022 5:01:19 PM By: Rhae Hammock RN Entered By: Rhae Hammock on 07/06/2022 08:28:40 -------------------------------------------------------------------------------- Foot Assessment Details Patient Name: Date of Service: [name], [name] E. 07/06/2022 8:00 A M Medical Record Number: [id number] Patient Account Number: [id number] Date of Birth/Sex: Treating RN: 1941-09-06 (81 y.o. Erie Noe Primary Care Milla Wahlberg: Dorthy Cooler, [name] Other Clinician: Referring Marlia Schewe: Treating Suyash Amory/Extender: Alanda Slim, [name] Weeks in Treatment: 0 Foot Assessment Items Site Locations + = Sensation present, - = Sensation absent, C = Callus, U = Ulcer R = Redness, W = Warmth, M = Maceration, PU = Pre-ulcerative lesion F = Fissure, S = Swelling, D = Dryness Assessment Right: Left: Other Deformity: No No Prior Foot Ulcer: No No Prior Amputation: No No Charcot Joint: No No Ambulatory Status: Gait: Notes N/A NO LE  WOUNDS Electronic Signature(s) Signed: 07/06/2022 5:01:19 PM By: Rhae Hammock RN Entered By: Rhae Hammock on 07/06/2022 08:29:00 -------------------------------------------------------------------------------- Nutrition Risk Screening Details Patient Name: Date of Service: [name], [name] E. 07/06/2022 8:00 A M Medical Record Number: [id number] Patient Account Number: [id number] Date of Birth/Sex: Treating RN: August 24, 1941 (81 y.o. Burnadette Pop, [place] Primary Care Sherah Lund: Dorthy Cooler, [name] Other Clinician: Referring Despina Boan: Treating Tanaya Dunigan/Extender: Alanda Slim, [name] Weeks in Treatment: 0 Height (in): 70 Weight (lbs): 205 Body Mass Index (BMI): 29.4 Nutrition Risk Screening Items Score Screening NUTRITION RISK SCREEN: I have an illness or condition that made me change the kind and/or amount of food I eat 0 No I eat fewer than two meals per day 0 No I eat few fruits and vegetables, or milk products 0 No I have three or more drinks of beer, liquor or wine almost every day 0 No I have tooth or mouth problems that make it hard for me to eat 0 No I don't always have enough money to buy the food I need 0 No I eat alone most of the time 0 No I take three or more different prescribed or over-the-counter drugs a day 0 No Without wanting to, I have lost or gained 10 pounds in the last six months 0 No I am not always physically able to shop, cook and/or feed myself 0 No Nutrition Protocols Good Risk Protocol 0 No interventions needed Moderate Risk Protocol High Risk Proctocol Risk Level: Good Risk Score: 0 Electronic Signature(s) Signed: 07/06/2022 5:01:19 PM By: Rhae Hammock RN Entered By: Rhae Hammock on 07/06/2022 00:76:22

## 2022-07-06 NOTE — Progress Notes (Signed)
[name], Timothy ([id number]) Visit Report for 07/06/2022 Allergy List Details Patient Name: Date of Service: [name] RE, [place] X E. 07/06/2022 8:00 A M Medical Record Number: [id number] Patient Account Number: [id number] Date of Birth/Sex: Treating RN: 12/28/1940 (81 y.o. Erie Noe Primary Care Roczen Waymire: [name], [name] Other Clinician: Referring Margalit Leece: Treating Terea Neubauer/Extender: Alanda Slim, [name] Weeks in Treatment: 0 Allergies Active Allergies No Known Allergies Allergy Notes Electronic Signature(s) Signed: 07/06/2022 5:01:19 PM By: Rhae Hammock RN Entered By: Rhae Hammock on 07/06/2022 08:41:56 -------------------------------------------------------------------------------- Arrival Information Details Patient Name: Date of Service: [name], [name] E. 07/06/2022 8:00 A M Medical Record Number: [id number] Patient Account Number: [id number] Date of Birth/Sex: Treating RN: October 24, 1941 (81 y.o. [name], [name] Primary Care Alphonza Tramell: [name], [name] Other Clinician: Referring Jasie Meleski: Treating Kelsie Zaborowski/Extender: Alanda Slim, [name] Weeks in Treatment: 0 Visit Information Patient Arrived: Cane Arrival Time: 08:21 Accompanied By: self Transfer Assistance: Manual Patient Identification Verified: Yes Secondary Verification Process Completed: Yes Patient Requires Transmission-Based Precautions: No Patient Has Alerts: No History Since Last Visit Added or deleted any medications: No Any new allergies or adverse reactions: No Had a fall or experienced change in activities of daily living that may affect risk of falls: No Signs or symptoms of abuse/neglect since last visito No Hospitalized since last visit: No Implantable device outside of the clinic excluding cellular tissue based products placed in the center since last visit: No Has Dressing in Place as Prescribed: Yes Electronic Signature(s) Signed: 07/06/2022 5:01:19 PM By: Rhae Hammock RN Entered By: Rhae Hammock on 07/06/2022 08:24:22 -------------------------------------------------------------------------------- Clinic Level of Care Assessment Details Patient Name: Date of Service: [name], [name] E. 07/06/2022 8:00 A M Medical Record Number: [id number] Patient Account Number: [id number] Date of Birth/Sex: Treating RN: 04/03/1941 (81 y.o. [name], [place] Primary Care [name]: [name], [name] Other Clinician: Referring [name]: Treating [name]/Extender: Alanda Slim, [name] Weeks in Treatment: 0 Clinic Level of Care Assessment Items TOOL 4 Quantity Score X- 1 0 Use when only an EandM is performed on FOLLOW-UP visit ASSESSMENTS - Nursing Assessment / Reassessment X- 1 10 Reassessment of Co-morbidities (includes updates in patient status) X- 1 5 Reassessment of Adherence to Treatment Plan ASSESSMENTS - Wound and Skin A ssessment / Reassessment X - Simple Wound Assessment / Reassessment - one wound 1 5 '[]'$  - 0 Complex Wound Assessment / Reassessment - multiple wounds '[]'$  - 0 Dermatologic / Skin Assessment (not related to wound area) ASSESSMENTS - Focused Assessment '[]'$  - 0 Circumferential Edema Measurements - multi extremities '[]'$  - 0 Nutritional Assessment / Counseling / Intervention '[]'$  - 0 Lower Extremity Assessment (monofilament, tuning fork, pulses) '[]'$  - 0 Peripheral Arterial Disease Assessment (using hand held doppler) ASSESSMENTS - Ostomy and/or Continence Assessment and Care '[]'$  - 0 Incontinence Assessment and Management '[]'$  - 0 Ostomy Care Assessment and Management (repouching, etc.) PROCESS - Coordination of Care X - Simple Patient / Family Education for ongoing care 1 15 '[]'$  - 0 Complex (extensive) Patient / Family Education for ongoing care X- 1 10 Staff obtains Programmer, systems, Records, T Results / Process Orders est '[]'$  - 0 Staff telephones HHA, Nursing Homes / Clarify orders / etc '[]'$  - 0 Routine Transfer to another  Facility (non-emergent condition) '[]'$  - 0 Routine Hospital Admission (non-emergent condition) X- 1 15 New Admissions / Biomedical engineer / Ordering NPWT Apligraf, etc. , '[]'$  - 0 Emergency Hospital Admission (emergent condition) X- 1 10 Simple Discharge Coordination '[]'$  - 0 Complex (extensive) Discharge  Coordination PROCESS - Special Needs '[]'$  - 0 Pediatric / Minor Patient Management '[]'$  - 0 Isolation Patient Management '[]'$  - 0 Hearing / Language / Visual special needs '[]'$  - 0 Assessment of Community assistance (transportation, D/C planning, etc.) '[]'$  - 0 Additional assistance / Altered mentation '[]'$  - 0 Support Surface(s) Assessment (bed, cushion, seat, etc.) INTERVENTIONS - Wound Cleansing / Measurement X- 1 5 Simple Wound Cleansing - one wound '[]'$  - 0 Complex Wound Cleansing - multiple wounds X- 1 5 Wound Imaging (photographs - any number of wounds) '[]'$  - 0 Wound Tracing (instead of photographs) X- 1 5 Simple Wound Measurement - one wound '[]'$  - 0 Complex Wound Measurement - multiple wounds INTERVENTIONS - Wound Dressings X - Small Wound Dressing one or multiple wounds 1 10 '[]'$  - 0 Medium Wound Dressing one or multiple wounds '[]'$  - 0 Large Wound Dressing one or multiple wounds '[]'$  - 0 Application of Medications - topical '[]'$  - 0 Application of Medications - injection INTERVENTIONS - Miscellaneous '[]'$  - 0 External ear exam '[]'$  - 0 Specimen Collection (cultures, biopsies, blood, body fluids, etc.) '[]'$  - 0 Specimen(s) / Culture(s) sent or taken to Lab for analysis '[]'$  - 0 Patient Transfer (multiple staff / Civil Service fast streamer / Similar devices) '[]'$  - 0 Simple Staple / Suture removal (25 or less) '[]'$  - 0 Complex Staple / Suture removal (26 or more) '[]'$  - 0 Hypo / Hyperglycemic Management (close monitor of Blood Glucose) '[]'$  - 0 Ankle / Brachial Index (ABI) - do not check if billed separately X- 1 5 Vital Signs Has the patient been seen at the hospital within the last three  years: Yes Total Score: 100 Level Of Care: New/Established - Level 3 Electronic Signature(s) Signed: 07/06/2022 5:01:19 PM By: Rhae Hammock RN Entered By: Rhae Hammock on 07/06/2022 09:34:40 -------------------------------------------------------------------------------- Encounter Discharge Information Details Patient Name: Date of Service: [name], [name] E. 07/06/2022 8:00 A M Medical Record Number: [id number] Patient Account Number: [id number] Date of Birth/Sex: Treating RN: 1941/10/19 (81 y.o. Erie Noe Primary Care Cecilia Vancleve: [name], [name] Other Clinician: Referring Johnn Krasowski: Treating Durwin Davisson/Extender: Alanda Slim, [name] Weeks in Treatment: 0 Encounter Discharge Information Items Discharge Condition: Stable Ambulatory Status: Wheelchair Discharge Destination: Home Transportation: Private Auto Accompanied By: daughter Schedule Follow-up Appointment: Yes Clinical Summary of Care: Patient Declined Electronic Signature(s) Signed: 07/06/2022 5:01:19 PM By: Rhae Hammock RN Entered By: Rhae Hammock on 07/06/2022 09:35:38 -------------------------------------------------------------------------------- Lower Extremity Assessment Details Patient Name: Date of Service: [name], [name] E. 07/06/2022 8:00 A M Medical Record Number: [id number] Patient Account Number: [id number] Date of Birth/Sex: Treating RN: 1941/06/27 (81 y.o. Erie Noe Primary Care Audrey Eller: [name], [name] Other Clinician: Referring Kelan Pritt: Treating Gardner Servantes/Extender: Alanda Slim, [name] Weeks in Treatment: 0 Electronic Signature(s) Signed: 07/06/2022 5:01:19 PM By: Rhae Hammock RN Entered By: Rhae Hammock on 07/06/2022 08:29:06 -------------------------------------------------------------------------------- Multi Wound Chart Details Patient Name: Date of Service: [name], [name] E. 07/06/2022 8:00 A M Medical Record Number:  [id number] Patient Account Number: [id number] Date of Birth/Sex: Treating RN: 08/03/1941 (81 y.o. Erie Noe Primary Care Maddalynn Barnard: [name], [name] Other Clinician: Referring Paz Fuentes: Treating Halle Davlin/Extender: Alanda Slim, [name] Weeks in Treatment: 0 Vital Signs Height(in): 70 Capillary Blood Glucose(mg/dl): 230 Weight(lbs): 205 Pulse(bpm): 43 Body Mass Index(BMI): 29.4 Blood Pressure(mmHg): 145/70 Temperature(F): 98.7 Respiratory Rate(breaths/min): 17 Wound Assessments Treatment Notes Electronic Signature(s) Signed: 07/06/2022 12:56:03 PM By: Kalman Shan DO Signed: 07/06/2022 5:01:19 PM By: Rhae Hammock RN Entered By: Kalman Shan  on 07/06/2022 12:38:59 -------------------------------------------------------------------------------- [place] Details Patient Name: Date of Service: [name] RE, [place] 07/06/2022 8:00 A M Medical Record Number: [id number] Patient Account Number: [id number] Date of Birth/Sex: Treating RN: 16-Nov-1941 (81 y.o. Erie Noe Primary Care Oriah Leinweber: [name], [name] Other Clinician: Referring Muhsin Doris: Treating Shivani Barrantes/Extender: Alanda Slim, [name] Weeks in Treatment: 0 Active Inactive Electronic Signature(s) Signed: 07/06/2022 5:01:19 PM By: Rhae Hammock RN Entered By: Rhae Hammock on 07/06/2022 10:10:51 -------------------------------------------------------------------------------- Pain Assessment Details Patient Name: Date of Service: [name] RE, [name] E. 07/06/2022 8:00 A M Medical Record Number: [id number] Patient Account Number: [id number] Date of Birth/Sex: Treating RN: February 11, 1941 (81 y.o. Erie Noe Primary Care Astoria Condon: [name], [name] Other Clinician: Referring Toia Micale: Treating Lithzy Bernard/Extender: Alanda Slim, [name] Weeks in Treatment: 0 Active Problems Location of Pain Severity and Description of Pain Patient Has Paino  Yes Site Locations Pain Location: Generalized Pain, Pain in Ulcers With Dressing Change: Yes Duration of the Pain. Constant / Intermittento Intermittent Rate the pain. Current Pain Level: 5 Worst Pain Level: 10 Least Pain Level: 0 Tolerable Pain Level: 0 Character of Pain Describe the Pain: Aching Pain Management and Medication Current Pain Management: Medication: No Cold Application: No Rest: No Massage: No Activity: No T.E.N.S.: No Heat Application: No Leg drop or elevation: No Is the Current Pain Management Adequate: Adequate How does your wound impact your activities of daily livingo Sleep: No Bathing: No Appetite: No Relationship With Others: No Bladder Continence: No Emotions: No Bowel Continence: No Work: No Toileting: No Drive: No Dressing: No Hobbies: No Electronic Signature(s) Signed: 07/06/2022 5:01:19 PM By: Rhae Hammock RN Entered By: Rhae Hammock on 07/06/2022 08:31:17 -------------------------------------------------------------------------------- Patient/Caregiver Education Details Patient Name: Date of Service: [name] RE, [name] E. 8/10/2023andnbsp8:00 A M Medical Record Number: [id number] Patient Account Number: [id number] Date of Birth/Gender: Treating RN: 1941/08/28 (81 y.o. Erie Noe Primary Care Physician: [name], [name] Other Clinician: Referring Physician: Treating Physician/Extender: Alanda Slim, [name] Weeks in Treatment: 0 Education Assessment Education Provided To: Patient Education Topics Provided Welcome T [facility] [place]: o Methods: Explain/Verbal Responses: Reinforcements needed, State content correctly Electronic Signature(s) Signed: 07/06/2022 5:01:19 PM By: Rhae Hammock RN Entered By: Rhae Hammock on 07/06/2022 09:30:14 -------------------------------------------------------------------------------- Vitals Details Patient Name: Date of Service: [name], [name] E. 07/06/2022  8:00 A M Medical Record Number: [id number] Patient Account Number: [id number] Date of Birth/Sex: Treating RN: [birth year] (81 y.o. Erie Noe Primary Care Kemyah Buser: [name], [name] Other Clinician: Referring Kden Wagster: Treating Aamori Mcmasters/Extender: Alanda Slim, [name] Weeks in Treatment: 0 Vital Signs Time Taken: 08:24 Temperature (F): 98.7 Height (in): 70 Pulse (bpm): 43 Source: Stated Respiratory Rate (breaths/min): 17 Weight (lbs): 205 Blood Pressure (mmHg): 145/70 Source: Stated Capillary Blood Glucose (mg/dl): 230 Body Mass Index (BMI): 29.4 Reference Range: 80 - 120 mg / dl Electronic Signature(s) Signed: 07/06/2022 5:01:19 PM By: Rhae Hammock RN Entered By: Rhae Hammock on 07/06/2022 08:25:43

## 2022-07-06 NOTE — Progress Notes (Signed)
RAMONTE, MENA ([id number]) Visit Report for 07/06/2022 Chief Complaint Document Details Patient Name: Date of Service: MO Jenetta Downer RE, [place] 07/06/2022 8:00 A M Medical Record Number: [id number] Patient Account Number: [id number] Date of Birth/Sex: Treating RN: 01-May-1941 (81 y.o. Erie Noe Primary Care Provider: Dorthy Cooler, [name] Other Clinician: Referring Provider: Treating Provider/Extender: Alanda Slim, [name] Weeks in Treatment: 0 Information Obtained from: Patient Chief Complaint 10/04/2021; patient is here for a superficial pressure ulcer on his right buttock 07/06/2022; patient presents for superficial pressure ulcers on his right and left buttocks Electronic Signature(s) Signed: 07/06/2022 12:56:03 PM By: Kalman Shan DO Entered By: Kalman Shan on 07/06/2022 12:40:44 -------------------------------------------------------------------------------- HPI Details Patient Name: Date of Service: [name], [name] E. 07/06/2022 8:00 A M Medical Record Number: [id number] Patient Account Number: [id number] Date of Birth/Sex: Treating RN: July 26, 1941 (81 y.o. Erie Noe Primary Care Provider: Dorthy Cooler, [name] Other Clinician: Referring Provider: Treating Provider/Extender: Alanda Slim, [name] Weeks in Treatment: 0 History of Present Illness HPI Description: ADMISSION 10/04/2021 This is an 81 year old man who has a history of a recurrent wound on his right buttock. He is cared for at home by family members. He is referred here by [facility] physicians. Both areas and mirror image surrounding the mid part of the gluteal cleft have dry scaling nonblanchable skin cyst highly suggestive of excess pressure. He had a eschared area on the right buttock in this area I used a #3 curette to remove this there is still an open wound here which is a stage II pressure ulcer. 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 Uses  a walker otherwise he will fall. He sits on a recliner for most of the day but sleeps in a bed. Recently she has been using Triderma cream on the erythematous areas which [place] and she thinks that will help the skin in this area more than anything Past medical history includes basal cell CA, seborrheic dermatitis, type 2 diabetes, abdominal aortic aneurysm and TIA 07/06/2022 Patient presents for recurrence of wounds to his left and right buttocks. He states that these wax and wane in healing. He sits for long periods of time watching his favorite TV show, Gunsmoke. He has to ambulate with a cane and often uses a wheel chair. He only gets up to go to the bathroom. He does not have a cushion for his wheelchair. He tries to use a gel cushion for his lift chair. he has been using terasil with benefit. He denies signs of infection. Patient states he would like to follow-up as needed and is present today to See if there is anything further to do. Electronic Signature(s) Signed: 07/06/2022 12:56:03 PM By: Kalman Shan DO Entered By: Kalman Shan on 07/06/2022 12:44:33 -------------------------------------------------------------------------------- Physical Exam Details Patient Name: Date of Service: MO Jenetta Downer RE, [name] E. 07/06/2022 8:00 A M Medical Record Number: [id number] Patient Account Number: [id number] Date of Birth/Sex: Treating RN: [birth year] (81 y.o. Erie Noe Primary Care Provider: Dorthy Cooler, [name] Other Clinician: Referring Provider: Treating Provider/Extender: Alanda Slim, [name] Weeks in Treatment: 0 Constitutional respirations regular, non-labored and within target range for patient.Marland Kitchen Psychiatric pleasant and cooperative. Notes Right and left buttocks with skin breakdown. No surrounding signs of infection. Electronic Signature(s) Signed: 07/06/2022 12:56:03 PM By: Kalman Shan DO Entered By: Kalman Shan on 07/06/2022  12:44:59 -------------------------------------------------------------------------------- Physician Orders Details Patient Name: Date of Service: [name], [name] E. 07/06/2022  8:00 A M Medical Record Number: [id number] Patient Account Number: [id number] Date of Birth/Sex: Treating RN: 10-07-1941 (81 y.o. Erie Noe Primary Care Provider: Dorthy Cooler, [name] Other Clinician: Referring Provider: Treating Provider/Extender: Alanda Slim, [name] Weeks in Treatment: 0 Verbal / Phone Orders: No Diagnosis Coding Follow-up Appointments Other: - Consult only. Pt. will call if he needs an appt. Discharge From [facility] Services Discharge from [place] Bathing/ Shower/ Hygiene May shower and wash wound with soap and water. - when changing dressing Off-Loading Gel wheelchair cushion Turn and reposition every 2 hours Other: - Keep pressure off area as much as possible. Non Wound Condition Cleanse area with: - clean with vashe cleanser and apply terrasil daily. Electronic Signature(s) Signed: 07/06/2022 12:56:03 PM By: Kalman Shan DO Entered By: Kalman Shan on 07/06/2022 12:45:06 -------------------------------------------------------------------------------- Problem List Details Patient Name: Date of Service: [name], [name] E. 07/06/2022 8:00 A M Medical Record Number: [id number] Patient Account Number: [id number] Date of Birth/Sex: Treating RN: 1941/02/27 (81 y.o. Erie Noe Primary Care Provider: Dorthy Cooler, [name] Other Clinician: Referring Provider: Treating Provider/Extender: Alanda Slim, [name] Weeks in Treatment: 0 Active Problems ICD-10 Encounter Code Description Active Date MDM Diagnosis L89.322 Pressure ulcer of left buttock, stage 2 07/06/2022 No Yes L89.312 Pressure ulcer of right buttock, stage 2 07/06/2022 No Yes Inactive Problems Resolved Problems Electronic Signature(s) Signed: 07/06/2022 12:56:03 PM By: Kalman Shan  DO Entered By: Kalman Shan on 07/06/2022 12:38:56 -------------------------------------------------------------------------------- Progress Note Details Patient Name: Date of Service: [name], [name] E. 07/06/2022 8:00 A M Medical Record Number: [id number] Patient Account Number: [id number] Date of Birth/Sex: Treating RN: 02-26-1941 (81 y.o. Erie Noe Primary Care Provider: Dorthy Cooler, [name] Other Clinician: Referring Provider: Treating Provider/Extender: Alanda Slim, [name] Weeks in Treatment: 0 Subjective Chief Complaint Information obtained from Patient 10/04/2021; patient is here for a superficial pressure ulcer on his right buttock 07/06/2022; patient presents for superficial pressure ulcers on his right and left buttocks History of Present Illness (HPI) ADMISSION 10/04/2021 This is an 81 year old man who has a history of a recurrent wound on his right buttock. He is cared for at home by family members. He is referred here by [facility] physicians. Both areas and mirror image surrounding the mid part of the gluteal cleft have dry scaling nonblanchable skin cyst highly suggestive of excess pressure. He had a eschared area on the right buttock in this area I used a #3 curette to remove this there is still an open wound here which is a stage II pressure ulcer. 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 Uses a walker otherwise he will fall. He sits on a recliner for most of the day but sleeps in a bed. Recently she has been using Triderma cream on the erythematous areas which she got on [place] and she thinks that will help the skin in this area more than anything Past medical history includes basal cell CA, seborrheic dermatitis, type 2 diabetes, abdominal aortic aneurysm and TIA 07/06/2022 Patient presents for recurrence of wounds to his left and right buttocks. He states that these wax and wane in healing. He sits for  long periods of time watching his favorite TV show, Gunsmoke. He has to ambulate with a cane and often uses a wheel chair. He only gets up to go to the bathroom. He does not have a cushion for his wheelchair. He tries to use a gel cushion for his lift chair.  he has been using terasil with benefit. He denies signs of infection. Patient states he would like to follow-up as needed and is present today to See if there is anything further to do. Patient History Information obtained from Patient. Allergies No Known Allergies Family History Cancer - Siblings, Hypertension - Siblings, No family history of Diabetes, Heart Disease, Hereditary Spherocytosis, Kidney Disease, Lung Disease, Seizures, Stroke, Thyroid Problems, Tuberculosis. Social History Former smoker, Marital Status - Married, Alcohol Use - Never, Drug Use - No History, Caffeine Use - Daily. Medical History Eyes Patient has history of Cataracts - had surgery Cardiovascular Patient has history of Coronary Artery Disease, Hypertension Endocrine Patient has history of Type II Diabetes Musculoskeletal Patient has history of Osteoarthritis Medical A Surgical History Notes nd Cardiovascular AAA Oncologic Skin Cancer Right Arm Objective Constitutional respirations regular, non-labored and within target range for patient.. Vitals Time Taken: 8:24 AM, Height: 70 in, Source: Stated, Weight: 205 lbs, Source: Stated, BMI: 29.4, Temperature: 98.7 F, Pulse: 43 bpm, Respiratory Rate: 17 breaths/min, Blood Pressure: 145/70 mmHg, Capillary Blood Glucose: 230 mg/dl. Psychiatric pleasant and cooperative. General Notes: Right and left buttocks with skin breakdown. No surrounding signs of infection. Assessment Active Problems ICD-10 Pressure ulcer of left buttock, stage 2 Pressure ulcer of right buttock, stage 2 Patient presents with wounds to his left and right buttocks. These are caused by sitting for long periods of time resulting in  friction and moisture retention. I think it is fine to continue with terasil. I did recommending using Vashe wound cleanser daily prior to dressing changes. We also had a long discussion about the importance of aggressive offloading when developing wounds to the bottom. I recommended he reposition every couple hours in his chair or to ambulate. We will order a Roho cushion for his wheelchair. He would like to follow-up as needed. Plan Follow-up Appointments: Other: - Consult only. Pt. will call if he needs an appt. Discharge From [facility] Services: Discharge from [place] Bathing/ Shower/ Hygiene: May shower and wash wound with soap and water. - when changing dressing Off-Loading: Gel wheelchair cushion Turn and reposition every 2 hours Other: - Keep pressure off area as much as possible. Non Wound Condition: Cleanse area with: - clean with vashe cleanser and apply terrasil daily. 1. Continue Terasil 2. Aggressive offloadingooRoho cushion and gel cushion 3. Reposition every couple hours 4. Follow-up as needed Electronic Signature(s) Signed: 07/06/2022 12:56:03 PM By: Kalman Shan DO Entered By: Kalman Shan on 07/06/2022 12:47:42 -------------------------------------------------------------------------------- HxROS Details Patient Name: Date of Service: [name], [name] E. 07/06/2022 8:00 A M Medical Record Number: [id number] Patient Account Number: [id number] Date of Birth/Sex: Treating RN: January 14, 1941 (81 y.o. Erie Noe Primary Care Provider: Dorthy Cooler, [name] Other Clinician: Referring Provider: Treating Provider/Extender: Alanda Slim, [name] Weeks in Treatment: 0 Information Obtained From Patient Eyes Medical History: Positive for: Cataracts - had surgery Cardiovascular Medical History: Positive for: Coronary Artery Disease; Hypertension Past Medical History Notes: AAA Endocrine Medical History: Positive for: Type II Diabetes Time with  diabetes: 30 years Treated with: Oral agents Blood sugar tested every day: No Musculoskeletal Medical History: Positive for: Osteoarthritis Oncologic Medical History: Past Medical History Notes: Skin Cancer Right Arm HBO Extended History Items Eyes: Cataracts Immunizations Pneumococcal Vaccine: Received Pneumococcal Vaccination: Yes Received Pneumococcal Vaccination On or After 60th Birthday: Yes Implantable Devices None Family and Social History Cancer: Yes - Siblings; Diabetes: No; Heart Disease: No; Hereditary Spherocytosis: No; Hypertension: Yes - Siblings; Kidney Disease: No; Lung  Disease: No; Seizures: No; Stroke: No; Thyroid Problems: No; Tuberculosis: No; Former smoker; Marital Status - Married; Alcohol Use: Never; Drug Use: No History; Caffeine Use: Daily; Financial Concerns: No; Food, Clothing or Shelter Needs: No; Support System Lacking: No; Transportation Concerns: No Electronic Signature(s) Signed: 07/06/2022 12:56:03 PM By: Kalman Shan DO Signed: 07/06/2022 5:01:19 PM By: Rhae Hammock RN Entered By: Rhae Hammock on 07/06/2022 08:25:13 -------------------------------------------------------------------------------- SuperBill Details Patient Name: Date of Service: [name], [name] E. 07/06/2022 Medical Record Number: [id number] Patient Account Number: [id number] Date of Birth/Sex: Treating RN: 1941/02/22 (81 y.o. Erie Noe Primary Care Provider: Dorthy Cooler, [name] Other Clinician: Referring Provider: Treating Provider/Extender: Alanda Slim, [name] Weeks in Treatment: 0 Diagnosis Coding ICD-10 Codes Code Description [phone] Pressure ulcer of left buttock, stage 2 L89.312 Pressure ulcer of right buttock, stage 2 Facility Procedures CPT4 Code: [id number] Description: 99213 - WOUND CARE VISIT-LEV 3 EST PT Modifier: Quantity: 1 Physician Procedures : CPT4 Code Description Modifier [id number] - WC PHYS LEVEL 3 - EST PT ICD-10  Diagnosis Description L89.322 Pressure ulcer of left buttock, stage 2 L89.312 Pressure ulcer of right buttock, stage 2 Quantity: 1 Electronic Signature(s) Signed: 07/06/2022 12:56:03 PM By: Kalman Shan DO Entered By: Kalman Shan on 07/06/2022 12:48:04

## 2022-10-22 ENCOUNTER — Other Ambulatory Visit (HOSPITAL_COMMUNITY): Payer: Medicare Other

## 2022-10-22 ENCOUNTER — Other Ambulatory Visit: Payer: Self-pay

## 2022-10-22 ENCOUNTER — Emergency Department (HOSPITAL_COMMUNITY): Payer: Medicare Other

## 2022-10-22 ENCOUNTER — Emergency Department (HOSPITAL_COMMUNITY)
Admission: EM | Admit: 2022-10-22 | Discharge: 2022-10-22 | Disposition: A | Discharge status: Home / Self Care | Payer: Medicare Other | Attending: Emergency Medicine | Admitting: Emergency Medicine

## 2022-10-22 ENCOUNTER — Encounter (HOSPITAL_COMMUNITY): Payer: Self-pay

## 2022-10-22 DIAGNOSIS — M436 Torticollis: Secondary | ICD-10-CM | POA: Insufficient documentation

## 2022-10-22 DIAGNOSIS — R531 Weakness: Secondary | ICD-10-CM | POA: Insufficient documentation

## 2022-10-22 DIAGNOSIS — Y9301 Activity, walking, marching and hiking: Secondary | ICD-10-CM | POA: Insufficient documentation

## 2022-10-22 DIAGNOSIS — S199XXA Unspecified injury of neck, initial encounter: Secondary | ICD-10-CM | POA: Diagnosis not present

## 2022-10-22 DIAGNOSIS — W01190A Fall on same level from slipping, tripping and stumbling with subsequent striking against furniture, initial encounter: Secondary | ICD-10-CM | POA: Diagnosis not present

## 2022-10-22 DIAGNOSIS — Z7982 Long term (current) use of aspirin: Secondary | ICD-10-CM | POA: Diagnosis not present

## 2022-10-22 DIAGNOSIS — R519 Headache, unspecified: Secondary | ICD-10-CM | POA: Diagnosis present

## 2022-10-22 DIAGNOSIS — T699XXA Effect of reduced temperature, unspecified, initial encounter: Secondary | ICD-10-CM | POA: Diagnosis not present

## 2022-10-22 DIAGNOSIS — Y92008 Other place in unspecified non-institutional (private) residence as the place of occurrence of the external cause: Secondary | ICD-10-CM | POA: Insufficient documentation

## 2022-10-22 DIAGNOSIS — S0181XA Laceration without foreign body of other part of head, initial encounter: Secondary | ICD-10-CM | POA: Diagnosis not present

## 2022-10-22 DIAGNOSIS — R58 Hemorrhage, not elsewhere classified: Secondary | ICD-10-CM | POA: Diagnosis not present

## 2022-10-22 DIAGNOSIS — S0993XA Unspecified injury of face, initial encounter: Secondary | ICD-10-CM | POA: Diagnosis not present

## 2022-10-22 DIAGNOSIS — Z981 Arthrodesis status: Secondary | ICD-10-CM | POA: Diagnosis not present

## 2022-10-22 DIAGNOSIS — G4489 Other headache syndrome: Secondary | ICD-10-CM | POA: Diagnosis not present

## 2022-10-22 DIAGNOSIS — Z743 Need for continuous supervision: Secondary | ICD-10-CM | POA: Diagnosis not present

## 2022-10-22 DIAGNOSIS — W19XXXA Unspecified fall, initial encounter: Secondary | ICD-10-CM

## 2022-10-22 DIAGNOSIS — Z23 Encounter for immunization: Secondary | ICD-10-CM | POA: Diagnosis not present

## 2022-10-22 DIAGNOSIS — R6889 Other general symptoms and signs: Secondary | ICD-10-CM | POA: Diagnosis not present

## 2022-10-22 DIAGNOSIS — R9082 White matter disease, unspecified: Secondary | ICD-10-CM | POA: Diagnosis not present

## 2022-10-22 MED ORDER — ACETAMINOPHEN 500 MG PO TABS
1000.0000 mg | ORAL_TABLET | Freq: Once | ORAL | Status: AC
  Administered 2022-10-22: 1000 mg via ORAL
  Filled 2022-10-22: qty 2

## 2022-10-22 MED ORDER — OXYCODONE HCL 5 MG PO TABS
2.5000 mg | ORAL_TABLET | Freq: Once | ORAL | Status: AC
  Administered 2022-10-22: 2.5 mg via ORAL
  Filled 2022-10-22: qty 1

## 2022-10-22 MED ORDER — LIDOCAINE-EPINEPHRINE (PF) 2 %-1:200000 IJ SOLN
10.0000 mL | Freq: Once | INTRAMUSCULAR | Status: AC
  Administered 2022-10-22: 10 mL via INTRADERMAL
  Filled 2022-10-22: qty 20

## 2022-10-22 MED ORDER — TETANUS-DIPHTH-ACELL PERTUSSIS 5-2.5-18.5 LF-MCG/0.5 IM SUSY
0.5000 mL | PREFILLED_SYRINGE | Freq: Once | INTRAMUSCULAR | Status: AC
  Administered 2022-10-22: 0.5 mL via INTRAMUSCULAR
  Filled 2022-10-22: qty 0.5

## 2022-10-22 NOTE — ED Triage Notes (Signed)
Pt bib [facility] from home where he slipped on wet floor and fell face first. No LOC no thinner. Hx stroke with left side deficits  CBG 225, 156/83, 75HR afib, 96% RA

## 2022-10-22 NOTE — Discharge Instructions (Addendum)
The CT scan did not show any obvious broken bone in the neck or bleeding inside your skull.  Please return for worsening headache confusion or persistent vomiting.  Whenever you fall we recommend that you see your family doctor in the office.  We may want to reevaluate the medications you are on or perhaps offer physical therapy or have someone come to your house to make sure that it safe.  Return for redness drainage or if you get a fever.  The sutures that were used are dissolvable that should dissolve between day 3 and day 5.  If they are still there after day 7 then you can gently plucked them out with tweezers.  The area can get wet but not fully immersed underwater.  No scrubbing.  If you really want to clean it you can apply a half-and-half hydrogen peroxide solution with water on a Q-tip.  You can apply an ointment a couple times a day this could be as simple as Vaseline but could also be an antibiotic ointment if you wish.  Once it is healed please try to avoid prolonged sun exposure use sunscreen.  Gells that have silicone antigens have been shown to reduce scarring in some research.

## 2022-10-22 NOTE — ED Provider Notes (Signed)
[facility] EMERGENCY DEPARTMENT Provider Note   CSN: [id number] Arrival date & time: 10/22/22  1243     History  Chief Complaint  Patient presents with   [name] is a 81 y.o. male.  81 yo M with a chief complaints of fall.  Patient states he was walking down the hallway and did not realize that his wife had recently washed the floor.  His cane and up slipping out from under him and he landed on his face against the table.  He is complaining mostly of pain to his head.   Fall       Home Medications Prior to Admission medications   Medication Sig Start Date End Date Taking? Authorizing Provider  acetaminophen (TYLENOL) 500 MG tablet Take 1,000 mg by mouth every 6 (six) hours as needed for mild pain.   Yes [provider]  amLODipine (NORVASC) 5 MG tablet Take 5 mg by mouth daily. 08/24/22  Yes [provider]  aspirin 81 MG tablet Take 81 mg by mouth at bedtime.   Yes [provider]  diclofenac Sodium (VOLTAREN) 1 % GEL Apply 2 g topically daily as needed (pain).   Yes [provider]  furosemide (LASIX) 20 MG tablet Take 20 mg by mouth daily.   Yes [provider]  glimepiride (AMARYL) 2 MG tablet Take 2 mg by mouth daily with breakfast.   Yes [provider]  ibuprofen (ADVIL) 200 MG tablet Take 400 mg by mouth daily as needed for mild pain.   Yes [provider]  lisinopril (ZESTRIL) 20 MG tablet Take 20 mg by mouth daily. 01/19/17  Yes [provider]  LYRICA 150 MG capsule TAKE 1 CAPSULE BY MOUTH THREE TIMES DAILY Patient taking differently: Take 150 mg by mouth 2 (two) times daily. 12/23/13  Yes [name], MD  metFORMIN (GLUCOPHAGE) 1000 MG tablet Take 1,000 mg by mouth 2 (two) times daily with a meal.   Yes [provider]  metoprolol (LOPRESSOR) 50 MG tablet TAKE 1 AND 1/2 TABLETS BY MOUTH TWICE DAILY Patient taking differently: Take 75 mg by mouth 2  (two) times daily. 12/24/13  Yes [name], MD  Multiple Vitamin (MULTIVITAMIN) tablet Take 1 tablet by mouth daily.   Yes [provider]  pravastatin (PRAVACHOL) 80 MG tablet Take 80 mg by mouth at bedtime.   Yes [provider]  pregabalin (LYRICA) 75 MG capsule Take 1 capsule (75 mg total) by mouth 2 (two) times daily. 02/13/13   [name], MD      Allergies    Patient has no known allergies.    Review of Systems   Review of Systems  Physical Exam Updated Vital Signs BP (!) 153/68   Pulse 62   Temp 98 F (36.7 C) (Oral)   Resp 20   Ht '5\' 10"'$  (1.778 m)   Wt 91.6 kg   SpO2 96%   BMI 28.98 kg/m  Physical Exam Vitals and nursing note reviewed.  Constitutional:      Appearance: He is well-developed.  HENT:     Head: Normocephalic.     Comments: Large and gaping laceration to the center of the forehead.  Some extension to the bridge of the nose.  No nasal septal hematoma.  Extraocular motions intact.  No obvious intra ocular acute pathology. Eyes:     Pupils: Pupils are equal, round, and reactive to light.  Neck:  Vascular: No JVD.  Cardiovascular:     Rate and Rhythm: Normal rate and regular rhythm.     Heart sounds: No murmur heard.    No friction rub. No gallop.  Pulmonary:     Effort: No respiratory distress.     Breath sounds: No wheezing.  Abdominal:     General: There is no distension.     Tenderness: There is no abdominal tenderness. There is no guarding or rebound.  Musculoskeletal:        General: Normal range of motion.     Cervical back: Normal range of motion and neck supple.     Comments: No obvious midline spinal tenderness step-offs or deformities.  The patient does have chronic neck stiffness after having a C-spine fusion.  Skin:    Coloration: Skin is not pale.     Findings: No rash.  Neurological:     Mental Status: He is alert and oriented to person, place, and time.     Comments: Left-sided weakness from prior  stroke  Psychiatric:        Behavior: Behavior normal.     ED Results / Procedures / Treatments   Labs (all labs ordered are listed, but only abnormal results are displayed) Labs Reviewed - No data to display  EKG None  Radiology CT Head Wo Contrast  Result Date: 10/22/2022 CLINICAL DATA:  Slip and fall EXAM: CT HEAD WITHOUT CONTRAST CT MAXILLOFACIAL WITHOUT CONTRAST CT CERVICAL SPINE WITHOUT CONTRAST TECHNIQUE: Multidetector CT imaging of the head, cervical spine, and maxillofacial structures were performed using the standard protocol without intravenous contrast. Multiplanar CT image reconstructions of the cervical spine and maxillofacial structures were also generated. RADIATION DOSE REDUCTION: This exam was performed according to the departmental dose-optimization program which includes automated exposure control, adjustment of the mA and/or kV according to patient size and/or use of iterative reconstruction technique. COMPARISON:  None Available. FINDINGS: CT HEAD FINDINGS Brain: No evidence of acute infarction, hemorrhage, hydrocephalus, extra-axial collection or mass lesion/mass effect. Mild periventricular and deep white matter hypodensity. Vascular: No hyperdense vessel or unexpected calcification. CT FACIAL BONES FINDINGS Skull: Normal. Negative for fracture or focal lesion. Facial bones: No displaced fractures or dislocations. Sinuses/Orbits: No acute finding. Other: Soft tissue contusion and laceration of the right forehead (series 3, image 82). CT CERVICAL SPINE FINDINGS Alignment: Straightening of the normal cervical lordosis. Skull base and vertebrae: No acute fracture. No primary bone lesion or focal pathologic process. Soft tissues and spinal canal: No prevertebral fluid or swelling. No visible canal hematoma. Disc levels: Status post posterior laminectomy and fusion of C3-C7, with severe OPLL at these levels (series 9, image 63). Upper chest: Negative. Other: None. IMPRESSION: 1.  No acute intracranial pathology. Small-vessel white matter disease. 2. No displaced fractures or dislocations of the facial bones. 3. Soft tissue contusion and laceration of the right forehead. 4. No fracture or static subluxation of the cervical spine. 5. Status post posterior laminectomy and fusion of C3-C7, with severe OPLL at these levels. Electronically Signed   By: Delanna Ahmadi M.D.   On: 10/22/2022 14:45   CT Maxillofacial Wo Contrast  Result Date: 10/22/2022 CLINICAL DATA:  Slip and fall EXAM: CT HEAD WITHOUT CONTRAST CT MAXILLOFACIAL WITHOUT CONTRAST CT CERVICAL SPINE WITHOUT CONTRAST TECHNIQUE: Multidetector CT imaging of the head, cervical spine, and maxillofacial structures were performed using the standard protocol without intravenous contrast. Multiplanar CT image reconstructions of the cervical spine and maxillofacial structures were also generated. RADIATION DOSE REDUCTION: This  exam was performed according to the departmental dose-optimization program which includes automated exposure control, adjustment of the mA and/or kV according to patient size and/or use of iterative reconstruction technique. COMPARISON:  None Available. FINDINGS: CT HEAD FINDINGS Brain: No evidence of acute infarction, hemorrhage, hydrocephalus, extra-axial collection or mass lesion/mass effect. Mild periventricular and deep white matter hypodensity. Vascular: No hyperdense vessel or unexpected calcification. CT FACIAL BONES FINDINGS Skull: Normal. Negative for fracture or focal lesion. Facial bones: No displaced fractures or dislocations. Sinuses/Orbits: No acute finding. Other: Soft tissue contusion and laceration of the right forehead (series 3, image 82). CT CERVICAL SPINE FINDINGS Alignment: Straightening of the normal cervical lordosis. Skull base and vertebrae: No acute fracture. No primary bone lesion or focal pathologic process. Soft tissues and spinal canal: No prevertebral fluid or swelling. No visible canal  hematoma. Disc levels: Status post posterior laminectomy and fusion of C3-C7, with severe OPLL at these levels (series 9, image 63). Upper chest: Negative. Other: None. IMPRESSION: 1. No acute intracranial pathology. Small-vessel white matter disease. 2. No displaced fractures or dislocations of the facial bones. 3. Soft tissue contusion and laceration of the right forehead. 4. No fracture or static subluxation of the cervical spine. 5. Status post posterior laminectomy and fusion of C3-C7, with severe OPLL at these levels. Electronically Signed   By: Delanna Ahmadi M.D.   On: 10/22/2022 14:45   CT Cervical Spine Wo Contrast  Result Date: 10/22/2022 CLINICAL DATA:  Slip and fall EXAM: CT HEAD WITHOUT CONTRAST CT MAXILLOFACIAL WITHOUT CONTRAST CT CERVICAL SPINE WITHOUT CONTRAST TECHNIQUE: Multidetector CT imaging of the head, cervical spine, and maxillofacial structures were performed using the standard protocol without intravenous contrast. Multiplanar CT image reconstructions of the cervical spine and maxillofacial structures were also generated. RADIATION DOSE REDUCTION: This exam was performed according to the departmental dose-optimization program which includes automated exposure control, adjustment of the mA and/or kV according to patient size and/or use of iterative reconstruction technique. COMPARISON:  None Available. FINDINGS: CT HEAD FINDINGS Brain: No evidence of acute infarction, hemorrhage, hydrocephalus, extra-axial collection or mass lesion/mass effect. Mild periventricular and deep white matter hypodensity. Vascular: No hyperdense vessel or unexpected calcification. CT FACIAL BONES FINDINGS Skull: Normal. Negative for fracture or focal lesion. Facial bones: No displaced fractures or dislocations. Sinuses/Orbits: No acute finding. Other: Soft tissue contusion and laceration of the right forehead (series 3, image 82). CT CERVICAL SPINE FINDINGS Alignment: Straightening of the normal cervical  lordosis. Skull base and vertebrae: No acute fracture. No primary bone lesion or focal pathologic process. Soft tissues and spinal canal: No prevertebral fluid or swelling. No visible canal hematoma. Disc levels: Status post posterior laminectomy and fusion of C3-C7, with severe OPLL at these levels (series 9, image 63). Upper chest: Negative. Other: None. IMPRESSION: 1. No acute intracranial pathology. Small-vessel white matter disease. 2. No displaced fractures or dislocations of the facial bones. 3. Soft tissue contusion and laceration of the right forehead. 4. No fracture or static subluxation of the cervical spine. 5. Status post posterior laminectomy and fusion of C3-C7, with severe OPLL at these levels. Electronically Signed   By: Delanna Ahmadi M.D.   On: 10/22/2022 14:45    Procedures .Marland KitchenLaceration Repair  Date/Time: 10/22/2022 2:11 PM  Performed by: Deno Etienne, DO Authorized by: Deno Etienne, DO   Consent:    Consent obtained:  Verbal   Consent given by:  Patient   Risks, benefits, and alternatives were discussed: yes     Risks discussed:  Infection, poor wound healing, pain and poor cosmetic result   Alternatives discussed:  No treatment Universal protocol:    Procedure explained and questions answered to patient or proxy's satisfaction: yes     Immediately prior to procedure, a time out was called: yes     Patient identity confirmed:  Verbally with patient Anesthesia:    Anesthesia method:  Local infiltration   Local anesthetic:  Lidocaine 2% WITH epi Laceration details:    Location:  Face   Face location:  Forehead   Length (cm):  7.8 Pre-procedure details:    Preparation:  Patient was prepped and draped in usual sterile fashion Exploration:    Limited defect created (wound extended): no     Hemostasis achieved with:  Epinephrine and direct pressure   Imaging obtained comment:  CT   Imaging outcome: foreign body not noted     Wound exploration: entire depth of wound  visualized     Wound extent: no underlying fracture     Contaminated: no   Treatment:    Area cleansed with:  Saline   Amount of cleaning:  Extensive   Irrigation solution:  Sterile saline   Irrigation volume:  80   Irrigation method:  Pressure wash and syringe   Visualized foreign bodies/material removed: no     Debridement:  None   Undermining:  None   Scar revision: no   Skin repair:    Repair method:  Sutures   Suture size:  5-0   Suture material:  Fast-absorbing gut   Suture technique:  Simple interrupted   Number of sutures:  10 Approximation:    Approximation:  Close Repair type:    Repair type:  Simple Post-procedure details:    Dressing:  Open (no dressing)   Procedure completion:  Tolerated well, no immediate complications     Medications Ordered in ED Medications  lidocaine-EPINEPHrine (XYLOCAINE W/EPI) 2 %-1:200000 (PF) injection 10 mL (10 mLs Intradermal Given 10/22/22 1448)  Tdap (BOOSTRIX) injection 0.5 mL (0.5 mLs Intramuscular Given 10/22/22 1319)  acetaminophen (TYLENOL) tablet 1,000 mg (1,000 mg Oral Given 10/22/22 1318)  oxyCODONE (Oxy IR/ROXICODONE) immediate release tablet 2.5 mg (2.5 mg Oral Given 10/22/22 1318)    ED Course/ Medical Decision Making/ A&P                           Medical Decision Making Amount and/or Complexity of Data Reviewed Radiology: ordered.  Risk OTC drugs. Prescription drug management.   81 yo M with a chief complaints of fall.  Nonsyncopal by history.  Struck his face on a table.  He is on a baby aspirin.  Has a fairly large laceration.  Will repair at bedside.  CT of the head face and C-spine.  On my record review I am unable to find a tetanus after 1960.  Will update that for him today.  CT of the head independently interpreted by me without obvious intracranial hemorrhage.  CT of the face without facial fracture.  CT of the C-spine without obvious acute injury.  Will discharge home.  PCP follow-up.  2:50 PM:  I  have discussed the diagnosis/risks/treatment options with the patient and family.  Evaluation and diagnostic testing in the emergency department does not suggest an emergent condition requiring admission or immediate intervention beyond what has been performed at this time.  They will follow up with PCP. We also discussed returning to the ED immediately if new or worsening sx  occur. We discussed the sx which are most concerning (e.g., sudden worsening pain, fever, inability to tolerate by mouth) that necessitate immediate return. Medications administered to the patient during their visit and any new prescriptions provided to the patient are listed below.  Medications given during this visit Medications  lidocaine-EPINEPHrine (XYLOCAINE W/EPI) 2 %-1:200000 (PF) injection 10 mL (10 mLs Intradermal Given 10/22/22 1448)  Tdap (BOOSTRIX) injection 0.5 mL (0.5 mLs Intramuscular Given 10/22/22 1319)  acetaminophen (TYLENOL) tablet 1,000 mg (1,000 mg Oral Given 10/22/22 1318)  oxyCODONE (Oxy IR/ROXICODONE) immediate release tablet 2.5 mg (2.5 mg Oral Given 10/22/22 1318)     The patient appears reasonably screen and/or stabilized for discharge and I doubt any other medical condition or other [facility] requiring further screening, evaluation, or treatment in the ED at this time prior to discharge.          Final Clinical Impression(s) / ED Diagnoses Final diagnoses:  Fall, initial encounter    Rx / DC Orders ED Discharge Orders     None         Deno Etienne, DO 10/22/22 1450

## 2022-10-22 NOTE — ED Notes (Signed)
Pt arrives in [facility] and has a laceration with controlled bleeding between eyes

## 2022-10-23 DIAGNOSIS — S0083XS Contusion of other part of head, sequela: Secondary | ICD-10-CM | POA: Diagnosis not present

## 2022-10-23 DIAGNOSIS — S0181XS Laceration without foreign body of other part of head, sequela: Secondary | ICD-10-CM | POA: Diagnosis not present

## 2022-11-21 DIAGNOSIS — L89152 Pressure ulcer of sacral region, stage 2: Secondary | ICD-10-CM | POA: Diagnosis not present

## 2022-11-21 DIAGNOSIS — I7143 Infrarenal abdominal aortic aneurysm, without rupture: Secondary | ICD-10-CM | POA: Diagnosis not present

## 2022-11-21 DIAGNOSIS — I10 Essential (primary) hypertension: Secondary | ICD-10-CM | POA: Diagnosis not present

## 2022-11-21 DIAGNOSIS — I2510 Atherosclerotic heart disease of native coronary artery without angina pectoris: Secondary | ICD-10-CM | POA: Diagnosis not present

## 2022-11-21 DIAGNOSIS — E7800 Pure hypercholesterolemia, unspecified: Secondary | ICD-10-CM | POA: Diagnosis not present

## 2022-11-21 DIAGNOSIS — M792 Neuralgia and neuritis, unspecified: Secondary | ICD-10-CM | POA: Diagnosis not present

## 2022-11-21 DIAGNOSIS — E1169 Type 2 diabetes mellitus with other specified complication: Secondary | ICD-10-CM | POA: Diagnosis not present

## 2022-11-21 DIAGNOSIS — Z0001 Encounter for general adult medical examination with abnormal findings: Secondary | ICD-10-CM | POA: Diagnosis not present

## 2022-11-21 DIAGNOSIS — Z79899 Other long term (current) drug therapy: Secondary | ICD-10-CM | POA: Diagnosis not present

## 2022-11-21 DIAGNOSIS — Z23 Encounter for immunization: Secondary | ICD-10-CM | POA: Diagnosis not present

## 2023-01-22 DIAGNOSIS — J069 Acute upper respiratory infection, unspecified: Secondary | ICD-10-CM | POA: Diagnosis not present

## 2023-01-22 DIAGNOSIS — Z1152 Encounter for screening for COVID-19: Secondary | ICD-10-CM | POA: Diagnosis not present

## 2023-01-22 DIAGNOSIS — R5383 Other fatigue: Secondary | ICD-10-CM | POA: Diagnosis not present

## 2023-01-22 DIAGNOSIS — R0982 Postnasal drip: Secondary | ICD-10-CM | POA: Diagnosis not present

## 2023-07-09 DIAGNOSIS — Z9181 History of falling: Secondary | ICD-10-CM | POA: Diagnosis not present

## 2023-07-09 DIAGNOSIS — E1140 Type 2 diabetes mellitus with diabetic neuropathy, unspecified: Secondary | ICD-10-CM | POA: Diagnosis not present

## 2023-07-09 DIAGNOSIS — M792 Neuralgia and neuritis, unspecified: Secondary | ICD-10-CM | POA: Diagnosis not present

## 2023-07-09 DIAGNOSIS — E7800 Pure hypercholesterolemia, unspecified: Secondary | ICD-10-CM | POA: Diagnosis not present

## 2023-07-09 DIAGNOSIS — Z79899 Other long term (current) drug therapy: Secondary | ICD-10-CM | POA: Diagnosis not present

## 2023-07-09 DIAGNOSIS — E1165 Type 2 diabetes mellitus with hyperglycemia: Secondary | ICD-10-CM | POA: Diagnosis not present

## 2023-07-09 DIAGNOSIS — I7143 Infrarenal abdominal aortic aneurysm, without rupture: Secondary | ICD-10-CM | POA: Diagnosis not present

## 2023-07-09 DIAGNOSIS — L89152 Pressure ulcer of sacral region, stage 2: Secondary | ICD-10-CM | POA: Diagnosis not present

## 2023-08-14 ENCOUNTER — Emergency Department (HOSPITAL_BASED_OUTPATIENT_CLINIC_OR_DEPARTMENT_OTHER): Payer: Medicare Other

## 2023-08-14 ENCOUNTER — Emergency Department (HOSPITAL_BASED_OUTPATIENT_CLINIC_OR_DEPARTMENT_OTHER)
Admission: EM | Admit: 2023-08-14 | Discharge: 2023-08-14 | Disposition: A | Discharge status: Home / Self Care | Payer: Medicare Other | Attending: Emergency Medicine | Admitting: Emergency Medicine

## 2023-08-14 ENCOUNTER — Other Ambulatory Visit: Payer: Self-pay

## 2023-08-14 DIAGNOSIS — S50311A Abrasion of right elbow, initial encounter: Secondary | ICD-10-CM | POA: Insufficient documentation

## 2023-08-14 DIAGNOSIS — S0081XA Abrasion of other part of head, initial encounter: Secondary | ICD-10-CM | POA: Insufficient documentation

## 2023-08-14 DIAGNOSIS — W010XXA Fall on same level from slipping, tripping and stumbling without subsequent striking against object, initial encounter: Secondary | ICD-10-CM | POA: Insufficient documentation

## 2023-08-14 DIAGNOSIS — S80212A Abrasion, left knee, initial encounter: Secondary | ICD-10-CM | POA: Insufficient documentation

## 2023-08-14 DIAGNOSIS — T07XXXA Unspecified multiple injuries, initial encounter: Secondary | ICD-10-CM

## 2023-08-14 DIAGNOSIS — S0990XA Unspecified injury of head, initial encounter: Secondary | ICD-10-CM

## 2023-08-14 DIAGNOSIS — S80211A Abrasion, right knee, initial encounter: Secondary | ICD-10-CM | POA: Insufficient documentation

## 2023-08-14 DIAGNOSIS — Y9301 Activity, walking, marching and hiking: Secondary | ICD-10-CM | POA: Diagnosis not present

## 2023-08-14 DIAGNOSIS — S80219A Abrasion, unspecified knee, initial encounter: Secondary | ICD-10-CM | POA: Diagnosis not present

## 2023-08-14 NOTE — ED Notes (Signed)
Discharge paperwork given and verbally understood. 

## 2023-08-14 NOTE — ED Provider Notes (Signed)
[place] EMERGENCY DEPARTMENT AT [facility] Provider Note   CSN: [id number] Arrival date & time: 08/14/23  1428     History  Chief Complaint  Patient presents with   [name] is a 82 y.o. male with past postresection for hypertension, hyperlipidemia, neuromuscular disorder, previous cervical fusion with some upper extremity weakness at baseline who normally walks with a cane.  Patient tripped over the threshold to his house while walking his dogs, he fell forward and struck forehead, right elbow and bilateral knees.  Wounds cleaned at the time with peroxide.  Continues to have some head and neck pain.  Was advised to come to "get checked out" by family physician.  Reports he has been ambulatory on lower extremities, and with normal range of motion of the upper arm.   Fall       Home Medications Prior to Admission medications   Medication Sig Start Date End Date Taking? Authorizing Provider  acetaminophen (TYLENOL) 500 MG tablet Take 1,000 mg by mouth every 6 (six) hours as needed for mild pain.    [provider]  amLODipine (NORVASC) 5 MG tablet Take 5 mg by mouth daily. 08/24/22   [provider]  aspirin 81 MG tablet Take 81 mg by mouth at bedtime.    [provider]  diclofenac Sodium (VOLTAREN) 1 % GEL Apply 2 g topically daily as needed (pain).    [provider]  furosemide (LASIX) 20 MG tablet Take 20 mg by mouth daily.    [provider]  glimepiride (AMARYL) 2 MG tablet Take 2 mg by mouth daily with breakfast.    [provider]  ibuprofen (ADVIL) 200 MG tablet Take 400 mg by mouth daily as needed for mild pain.    [provider]  lisinopril (ZESTRIL) 20 MG tablet Take 20 mg by mouth daily. 01/19/17   [provider]  LYRICA 150 MG capsule TAKE 1 CAPSULE BY MOUTH THREE TIMES DAILY Patient taking differently: Take 150 mg by mouth 2 (two) times daily. 12/23/13   Donita Brooks,  MD  metFORMIN (GLUCOPHAGE) 1000 MG tablet Take 1,000 mg by mouth 2 (two) times daily with a meal.    [provider]  metoprolol (LOPRESSOR) 50 MG tablet TAKE 1 AND 1/2 TABLETS BY MOUTH TWICE DAILY Patient taking differently: Take 75 mg by mouth 2 (two) times daily. 12/24/13   Donita Brooks, MD  Multiple Vitamin (MULTIVITAMIN) tablet Take 1 tablet by mouth daily.    [provider]  pravastatin (PRAVACHOL) 80 MG tablet Take 80 mg by mouth at bedtime.    [provider]  pregabalin (LYRICA) 75 MG capsule Take 1 capsule (75 mg total) by mouth 2 (two) times daily. 02/13/13   Donita Brooks, MD      Allergies    Patient has no known allergies.    Review of Systems   Review of Systems  All other systems reviewed and are negative.   Physical Exam Updated Vital Signs BP (!) 153/61 (BP Location: Right Arm)   Pulse (!) 58   Temp 97.9 F (36.6 C)   Resp 16   Ht 5\' 10"  (1.778 m)   Wt 91.2 kg   SpO2 96%   BMI 28.84 kg/m  Physical Exam Vitals and nursing note reviewed.  Constitutional:      General: He is not in acute distress.    Appearance: Normal appearance.  HENT:  Head: Normocephalic and atraumatic.  Eyes:     General:        Right eye: No discharge.        Left eye: No discharge.  Cardiovascular:     Rate and Rhythm: Normal rate and regular rhythm.     Heart sounds: No murmur heard.    No friction rub. No gallop.  Pulmonary:     Effort: Pulmonary effort is normal.     Breath sounds: Normal breath sounds.  Abdominal:     General: Bowel sounds are normal.     Palpations: Abdomen is soft.  Musculoskeletal:     Comments: Intact strength 5/5 of bilateral upper and lower extremities.  Some tenderness palpation over abrasions but no significant soft tissue swelling.  Normal range of motion of bilateral knees.  Patient can ambulate with assistance which is his baseline.  He arrives in c-collar.  After removal of c-collar with normal range of  motion of cervical spine to his baseline, some decreased motion of flexion/extension secondary to previous fusion.  Skin:    General: Skin is warm and dry.     Capillary Refill: Capillary refill takes less than 2 seconds.     Comments: Abrasion noted to right frontal forehead, right elbow, and bilateral knees, no active laceration, no active bleeding at this time.  Wounds are appropriately crusted for around 24 hours of healing.  Neurological:     Mental Status: He is alert and oriented to person, place, and time.  Psychiatric:        Mood and Affect: Mood normal.        Behavior: Behavior normal.     ED Results / Procedures / Treatments   Labs (all labs ordered are listed, but only abnormal results are displayed) Labs Reviewed - No data to display  EKG None  Radiology CT Head Wo Contrast  Result Date: 08/14/2023 CLINICAL DATA:  Blunt facial trauma. Trip with forehead injury. Head neck pain EXAM: CT HEAD WITHOUT CONTRAST CT MAXILLOFACIAL WITHOUT CONTRAST CT CERVICAL SPINE WITHOUT CONTRAST TECHNIQUE: Multidetector CT imaging of the head, cervical spine, and maxillofacial structures were performed using the standard protocol without intravenous contrast. Multiplanar CT image reconstructions of the cervical spine and maxillofacial structures were also generated. RADIATION DOSE REDUCTION: This exam was performed according to the departmental dose-optimization program which includes automated exposure control, adjustment of the mA and/or kV according to patient size and/or use of iterative reconstruction technique. COMPARISON:  10/22/2022 FINDINGS: CT HEAD FINDINGS Brain: No evidence of acute infarction, hemorrhage, hydrocephalus, extra-axial collection or mass lesion/mass effect. Mild cerebral volume loss. Vascular: No hyperdense vessel or unexpected calcification. Skull: Forehead swelling without acute fracture. CT MAXILLOFACIAL FINDINGS Osseous: No acute fracture. No mandibular dislocation.  Patient is edentulous. Orbits: No evidence of postseptal injury. Sinuses: Negative for hemosinus. Polyp in the left nasal cavity measuring 16 mm on axial images. Soft tissues: Swelling to the right forehead. No opaque foreign body. CT CERVICAL SPINE FINDINGS Alignment: Straightening of cervical lordosis. Skull base and vertebrae: C2-3 to C6-7 laminectomy and posterior-lateral fusion. The same levels are affected by posterior longitudinal ligament ossification. Ligamentous calcification around the degenerated LN to dental interval. Soft tissues and spinal canal: Negative Disc levels: Diffuse spinal canal decompression without recurrent high-grade bony spinal stenosis. Upper chest: Negative IMPRESSION: No evidence of acute intracranial or cervical spine injury. Negative for facial fracture. Chronic findings are described above. Electronically Signed   By: Tiburcio Pea M.D.   On: 08/14/2023 18:09  CT Cervical Spine Wo Contrast  Result Date: 08/14/2023 CLINICAL DATA:  Blunt facial trauma. Trip with forehead injury. Head neck pain EXAM: CT HEAD WITHOUT CONTRAST CT MAXILLOFACIAL WITHOUT CONTRAST CT CERVICAL SPINE WITHOUT CONTRAST TECHNIQUE: Multidetector CT imaging of the head, cervical spine, and maxillofacial structures were performed using the standard protocol without intravenous contrast. Multiplanar CT image reconstructions of the cervical spine and maxillofacial structures were also generated. RADIATION DOSE REDUCTION: This exam was performed according to the departmental dose-optimization program which includes automated exposure control, adjustment of the mA and/or kV according to patient size and/or use of iterative reconstruction technique. COMPARISON:  10/22/2022 FINDINGS: CT HEAD FINDINGS Brain: No evidence of acute infarction, hemorrhage, hydrocephalus, extra-axial collection or mass lesion/mass effect. Mild cerebral volume loss. Vascular: No hyperdense vessel or unexpected calcification. Skull:  Forehead swelling without acute fracture. CT MAXILLOFACIAL FINDINGS Osseous: No acute fracture. No mandibular dislocation. Patient is edentulous. Orbits: No evidence of postseptal injury. Sinuses: Negative for hemosinus. Polyp in the left nasal cavity measuring 16 mm on axial images. Soft tissues: Swelling to the right forehead. No opaque foreign body. CT CERVICAL SPINE FINDINGS Alignment: Straightening of cervical lordosis. Skull base and vertebrae: C2-3 to C6-7 laminectomy and posterior-lateral fusion. The same levels are affected by posterior longitudinal ligament ossification. Ligamentous calcification around the degenerated LN to dental interval. Soft tissues and spinal canal: Negative Disc levels: Diffuse spinal canal decompression without recurrent high-grade bony spinal stenosis. Upper chest: Negative IMPRESSION: No evidence of acute intracranial or cervical spine injury. Negative for facial fracture. Chronic findings are described above. Electronically Signed   By: Tiburcio Pea M.D.   On: 08/14/2023 18:09   CT Maxillofacial Wo Contrast  Result Date: 08/14/2023 CLINICAL DATA:  Blunt facial trauma. Trip with forehead injury. Head neck pain EXAM: CT HEAD WITHOUT CONTRAST CT MAXILLOFACIAL WITHOUT CONTRAST CT CERVICAL SPINE WITHOUT CONTRAST TECHNIQUE: Multidetector CT imaging of the head, cervical spine, and maxillofacial structures were performed using the standard protocol without intravenous contrast. Multiplanar CT image reconstructions of the cervical spine and maxillofacial structures were also generated. RADIATION DOSE REDUCTION: This exam was performed according to the departmental dose-optimization program which includes automated exposure control, adjustment of the mA and/or kV according to patient size and/or use of iterative reconstruction technique. COMPARISON:  10/22/2022 FINDINGS: CT HEAD FINDINGS Brain: No evidence of acute infarction, hemorrhage, hydrocephalus, extra-axial collection or  mass lesion/mass effect. Mild cerebral volume loss. Vascular: No hyperdense vessel or unexpected calcification. Skull: Forehead swelling without acute fracture. CT MAXILLOFACIAL FINDINGS Osseous: No acute fracture. No mandibular dislocation. Patient is edentulous. Orbits: No evidence of postseptal injury. Sinuses: Negative for hemosinus. Polyp in the left nasal cavity measuring 16 mm on axial images. Soft tissues: Swelling to the right forehead. No opaque foreign body. CT CERVICAL SPINE FINDINGS Alignment: Straightening of cervical lordosis. Skull base and vertebrae: C2-3 to C6-7 laminectomy and posterior-lateral fusion. The same levels are affected by posterior longitudinal ligament ossification. Ligamentous calcification around the degenerated LN to dental interval. Soft tissues and spinal canal: Negative Disc levels: Diffuse spinal canal decompression without recurrent high-grade bony spinal stenosis. Upper chest: Negative IMPRESSION: No evidence of acute intracranial or cervical spine injury. Negative for facial fracture. Chronic findings are described above. Electronically Signed   By: Tiburcio Pea M.D.   On: 08/14/2023 18:09    Procedures Procedures    Medications Ordered in ED Medications - No data to display  ED Course/ Medical Decision Making/ A&P  Medical Decision Making Amount and/or Complexity of Data Reviewed Radiology: ordered.   This patient is a 82 y.o. male  who presents to the ED for concern of fall, head injury, arm and leg abrasions.   Differential diagnoses prior to evaluation: The emergent differential diagnosis includes, but is not limited to,  epidural hematoma, subdural hematoma, skull fracture, subarachnoid hemorrhage, unstable cervical spine fracture, concussion vs other MSK injury  . This is not an exhaustive differential.   Past Medical History / Co-morbidities / Social History: Previous cervical fusion, hypertension,  hyperlipidemia, not currently taking blood thinner  Physical Exam: Physical exam performed. The pertinent findings include:  Intact strength 5/5 of bilateral upper and lower extremities.  Some tenderness palpation over abrasions but no significant soft tissue swelling.  Normal range of motion of bilateral knees.  Patient can ambulate with assistance which is his baseline.  He arrives in c-collar.  After removal of c-collar with normal range of motion of cervical spine to his baseline, some decreased motion of flexion/extension secondary to previous fusion.   Abrasion noted to right frontal forehead, right elbow, and bilateral knees, no active laceration, no active bleeding at this time.  Wounds are appropriately crusted for around 24 hours of healing.   Lab Tests/Imaging studies: I personally interpreted labs/imaging and the pertinent results include: Independently interpreted CT cervical spine, head, and maxillofacial without contrast which shows no evidence of acute fracture, intracranial bleed, or unstable cervical spine injury.. I agree with the radiologist interpretation.   Medications: I ordered medication including encouraged ibuprofen, Tylenol as needed, rest, no other treatment necessary at this time with no evidence of intracranial bleed or cervical spinal injury..  I have reviewed the patients home medicines and have made adjustments as needed.   Disposition: After consideration of the diagnostic results and the patients response to treatment, I feel that patient is stable for discharge with plan as above.   emergency department workup does not suggest an emergent condition requiring admission or immediate intervention beyond what has been performed at this time. The plan is: as above. The patient is safe for discharge and has been instructed to return immediately for worsening symptoms, change in symptoms or any other concerns.  Final Clinical Impression(s) / ED Diagnoses Final  diagnoses:  Multiple abrasions  Injury of head, initial encounter    Rx / DC Orders ED Discharge Orders     None         [place] 08/14/23 1817    Jacalyn Lefevre, MD 08/14/23 [id number]

## 2023-08-14 NOTE — Discharge Instructions (Signed)
Please use Tylenol for pain.  You may use 1000 mg of Tylenol every 6 hours.  Not to exceed 4 g of Tylenol within 24 hours.  Please keep the wounds clean and dry, and monitor for any signs of infection.  Please return if you are concerned about any developing infection.

## 2023-08-14 NOTE — ED Triage Notes (Signed)
Tripped over threshold to house. [place]are[place] Seat forward and struck forehead. Abrasion to right forehead. nose, left knee, right knee, right elbow. No hemorrhage. Complains of head and neck pain. C-collar applied in triage. Previous neck surgery 2011-with hardware. Reduce ROM in both UE after this surgery. Typically uses cane- was using one at the time of this fall. No thinners- 81mg  ASA nightly.

## 2023-09-24 DIAGNOSIS — E1165 Type 2 diabetes mellitus with hyperglycemia: Secondary | ICD-10-CM | POA: Diagnosis not present

## 2023-11-30 DIAGNOSIS — Z79899 Other long term (current) drug therapy: Secondary | ICD-10-CM | POA: Diagnosis not present

## 2023-11-30 DIAGNOSIS — I10 Essential (primary) hypertension: Secondary | ICD-10-CM | POA: Diagnosis not present

## 2023-11-30 DIAGNOSIS — E7800 Pure hypercholesterolemia, unspecified: Secondary | ICD-10-CM | POA: Diagnosis not present

## 2023-11-30 DIAGNOSIS — L89152 Pressure ulcer of sacral region, stage 2: Secondary | ICD-10-CM | POA: Diagnosis not present

## 2023-11-30 DIAGNOSIS — E1140 Type 2 diabetes mellitus with diabetic neuropathy, unspecified: Secondary | ICD-10-CM | POA: Diagnosis not present

## 2023-11-30 DIAGNOSIS — Z0001 Encounter for general adult medical examination with abnormal findings: Secondary | ICD-10-CM | POA: Diagnosis not present

## 2023-11-30 DIAGNOSIS — I7143 Infrarenal abdominal aortic aneurysm, without rupture: Secondary | ICD-10-CM | POA: Diagnosis not present

## 2023-12-03 ENCOUNTER — Other Ambulatory Visit: Payer: Self-pay | Admitting: Family Medicine

## 2023-12-03 DIAGNOSIS — I7143 Infrarenal abdominal aortic aneurysm, without rupture: Secondary | ICD-10-CM

## 2023-12-04 ENCOUNTER — Encounter (HOSPITAL_BASED_OUTPATIENT_CLINIC_OR_DEPARTMENT_OTHER): Payer: Medicare Other | Attending: General Surgery | Admitting: General Surgery

## 2023-12-04 DIAGNOSIS — Z8673 Personal history of transient ischemic attack (TIA), and cerebral infarction without residual deficits: Secondary | ICD-10-CM | POA: Insufficient documentation

## 2023-12-04 DIAGNOSIS — R2689 Other abnormalities of gait and mobility: Secondary | ICD-10-CM | POA: Diagnosis not present

## 2023-12-04 DIAGNOSIS — L89312 Pressure ulcer of right buttock, stage 2: Secondary | ICD-10-CM | POA: Diagnosis not present

## 2023-12-04 DIAGNOSIS — I714 Abdominal aortic aneurysm, without rupture, unspecified: Secondary | ICD-10-CM | POA: Insufficient documentation

## 2023-12-04 DIAGNOSIS — L89323 Pressure ulcer of left buttock, stage 3: Secondary | ICD-10-CM | POA: Diagnosis not present

## 2023-12-04 DIAGNOSIS — Z85828 Personal history of other malignant neoplasm of skin: Secondary | ICD-10-CM | POA: Insufficient documentation

## 2023-12-04 DIAGNOSIS — E119 Type 2 diabetes mellitus without complications: Secondary | ICD-10-CM | POA: Insufficient documentation

## 2023-12-04 DIAGNOSIS — L89322 Pressure ulcer of left buttock, stage 2: Secondary | ICD-10-CM | POA: Diagnosis not present

## 2023-12-04 DIAGNOSIS — L89313 Pressure ulcer of right buttock, stage 3: Secondary | ICD-10-CM | POA: Diagnosis not present

## 2023-12-04 NOTE — Progress Notes (Addendum)
 [name], [name] ([id number]) [id number] Page 1 of 9 Visit Report for 12/04/2023 Allergy List Details Patient Name: Date of Service: [name]. 12/04/2023 9:00 A M Medical Record Number: [id number] Patient Account Number: [id number] Date of Birth/Sex: Treating RN: 1941/10/31 (83 y.o. [name] Merleen Handing Primary Care German Manke: Regino, [name] Other Clinician: Referring [name]: Treating [name], [name] Weeks in Treatment: 0 Allergies Active Allergies No Known Allergies Allergy Notes Electronic Signature(s) Signed: 12/04/2023 12:32:21 PM By: [name] RN Entered By: [name] on 12/04/2023 09:06:40 -------------------------------------------------------------------------------- Arrival Information Details Patient Name: Date of Service: Timothy [name], [name] 12/04/2023 9:00 A M Medical Record Number: [id number] Patient Account Number: [id number] Date of Birth/Sex: Treating RN: January 05, 1941 (83 y.o. [name] [name] Primary Care Amilia Vandenbrink: Regino, [name] Other Clinician: Referring [name]: Treating [name], [name] Weeks in Treatment: 0 Visit Information Patient Arrived: Wheel Chair Arrival Time: 09:05 Accompanied By: wife Transfer Assistance: Manual Patient Identification Verified: Yes History Since Last Visit Added or deleted any medications: No Any new allergies or adverse reactions: No Had a fall or experienced change in activities of daily living that may affect risk of falls: No Signs or symptoms of abuse/neglect since last visito No Hospitalized since last visit: No Implantable device outside of the clinic excluding cellular tissue based products placed in the center since last visit: No Electronic Signature(s) Signed: 12/04/2023 12:32:21 PM By: [name] RN Entered By: [name] on 12/04/2023 09:06:01 [name] ([id number]) [id number]  Page 2 of 9 -------------------------------------------------------------------------------- Clinic Level of Care Assessment Details Patient Name: Date of Service: [name]. 12/04/2023 9:00 A M Medical Record Number: [id number] Patient Account Number: [id number] Date of Birth/Sex: Treating RN: Jan 14, 1941 (83 y.o. [name] [name] Primary Care Mats Jeanlouis: Regino, [name] Other Clinician: Referring [name]: Treating [name], [name] Weeks in Treatment: 0 Clinic Level of Care Assessment Items TOOL 1 Quantity Score X- 1 0 Use when EandM and Procedure is performed on INITIAL visit ASSESSMENTS - Nursing Assessment / Reassessment X- 1 20 General Physical Exam (combine w/ comprehensive assessment (listed just below) when performed on new pt. evals) X- 1 25 Comprehensive Assessment (HX, ROS, Risk Assessments, Wounds Hx, etc.) ASSESSMENTS - Wound and Skin Assessment / Reassessment X- 1 10 Dermatologic / Skin Assessment (not related to wound area) ASSESSMENTS - Ostomy and/or Continence Assessment and Care []  - 0 Incontinence Assessment and Management []  - 0 Ostomy Care Assessment and Management (repouching, etc.) PROCESS - Coordination of Care X - Simple Patient / Family Education for ongoing care 1 15 []  - 0 Complex (extensive) Patient / Family Education for ongoing care X- 1 10 Staff obtains Chiropractor, Records, T Results / Process Orders est X- 1 10 Staff telephones HHA, Nursing Homes / Clarify orders / etc []  - 0 Routine Transfer to another Facility (non-emergent condition) []  - 0 Routine Hospital Admission (non-emergent condition) X- 1 15 New Admissions / Manufacturing Engineer / Ordering NPWT Apligraf, etc. , []  - 0 Emergency Hospital Admission (emergent condition) PROCESS - Special Needs []  - 0 Pediatric / Minor Patient Management []  - 0 Isolation Patient Management []  - 0 Hearing / Language / Visual special needs []  -  0 Assessment of Community assistance (transportation, D/C planning, etc.) []  - 0 Additional assistance / Altered mentation X- 1 15 Support Surface(s) Assessment (bed, cushion, seat, etc.) INTERVENTIONS - Miscellaneous []  - 0 External ear exam []  - 0 Patient Transfer (multiple staff /  Hoyer Lift / Similar devices) []  - 0 Simple Staple / Suture removal (25 or less) []  - 0 Complex Staple / Suture removal (26 or more) []  - 0 Hypo/Hyperglycemic Management (do not check if billed separately) []  - 0 Ankle / Brachial Index (ABI) - do not check if billed separately Has the patient been seen at the hospital within the last three years: Yes Total Score: 120 Level Of Care: New/Established - Level 4 Electronic Signature(s) Signed: 12/04/2023 12:32:21 PM By: [name] RN Entered By: [name] on 12/04/2023 10:00:18 [name] ([id number]) [id number] Page 3 of 9 -------------------------------------------------------------------------------- Encounter Discharge Information Details Patient Name: Date of Service: [name]. 12/04/2023 9:00 A M Medical Record Number: [id number] Patient Account Number: [id number] Date of Birth/Sex: Treating RN: 1941/03/05 (83 y.o. [name] [name] Primary Care Keante Urizar: Regino, [name] Other Clinician: Referring [name]: Treating [name], [name] Weeks in Treatment: 0 Encounter Discharge Information Items Post Procedure Vitals Discharge Condition: Stable Temperature (F): 97.5 Ambulatory Status: Wheelchair Pulse (bpm): 61 Discharge Destination: Home Respiratory Rate (breaths/min): 18 Transportation: Private Auto Blood Pressure (mmHg): 149/79 Accompanied By: spouse Schedule Follow-up Appointment: Yes Clinical Summary of Care: Patient Declined Electronic Signature(s) Signed: 12/04/2023 12:32:21 PM By: [name] RN Entered By: [name] on 12/04/2023  10:09:50 -------------------------------------------------------------------------------- Lower Extremity Assessment Details Patient Name: Date of Service: Timothy [name], [name] 12/04/2023 9:00 A M Medical Record Number: [id number] Patient Account Number: [id number] Date of Birth/Sex: Treating RN: [birth year] (83 y.o. [name] [name] Primary Care Mitul Hallowell: Regino, [name] Other Clinician: Referring Ravon Mcilhenny: Treating Tyton Abdallah/Extender: [name], [name] Weeks in Treatment: 0 Electronic Signature(s) Signed: 12/04/2023 12:32:21 PM By: [name] RN Entered By: [name] on 12/04/2023 09:12:21 -------------------------------------------------------------------------------- Multi Wound Chart Details Patient Name: Date of Service: Timothy [name], [name] 12/04/2023 9:00 A M Medical Record Number: [id number] Patient Account Number: [id number] Date of Birth/Sex: Treating RN: 1941/05/26 (83 y.o. M) Primary Care Stephanie Littman: Regino, [name] Other Clinician: Referring Albaro Deviney: Treating Thurza Kwiecinski/Extender: [name], [name] Weeks in Treatment: 0 Vital Signs Height(in): 70 Pulse(bpm): 61 Weight(lbs): 202 Blood Pressure(mmHg): 149/79 Body Mass Index(BMI): 29 Temperature(F): 97.5 Respiratory Rate(breaths/min): [address] Estil [name] ([id number]) [id number] Page 4 of 9 [2:Photos:] [N/A:N/A] Right Gluteus Left Gluteus N/A Wound Location: Gradually Appeared Gradually Appeared N/A Wounding Event: Pressure Ulcer Pressure Ulcer N/A Primary Etiology: Cataracts, Coronary Artery Disease, Cataracts, Coronary Artery Disease, N/A Comorbid History: Hypertension, Myocardial Infarction, Hypertension, Myocardial Infarction, Type II Diabetes, Osteoarthritis Type II Diabetes, Osteoarthritis 03/27/2021 03/27/2021 N/A Date Acquired: 0 0 N/A Weeks of Treatment: Open Open N/A Wound Status: No No N/A Wound Recurrence: 0.4x0.4x0.2 0.3x0.3x0.1 N/A Measurements L x W x D  (cm) 0.126 0.071 N/A A (cm) : rea 0.025 0.007 N/A Volume (cm) : Category/Stage II Category/Stage II N/A Classification: Medium Medium N/A Exudate A mount: Serosanguineous Serosanguineous N/A Exudate Type: red, brown red, brown N/A Exudate Color: Distinct, outline attached Distinct, outline attached N/A Wound Margin: Medium (34-66%) Medium (34-66%) N/A Granulation A mount: Red Red N/A Granulation Quality: Medium (34-66%) Medium (34-66%) N/A Necrotic A mount: Eschar, Adherent Slough Eschar, Adherent Slough N/A Necrotic Tissue: Fat Layer (Subcutaneous Tissue): Yes Fat Layer (Subcutaneous Tissue): Yes N/A Exposed Structures: Fascia: No Fascia: No Tendon: No Tendon: No Muscle: No Muscle: No Joint: No Joint: No Bone: No Bone: No Small (1-33%) Small (1-33%) N/A Epithelialization: Debridement - Excisional Debridement - Excisional N/A Debridement: Pre-procedure Verification/Time Out 09:35 09:35 N/A Taken: Lidocaine  4% Topical Solution Lidocaine   4% Topical Solution N/A Pain Control: Necrotic/Eschar, Subcutaneous, Subcutaneous, Slough N/A Tissue Debrided: Slough Skin/Subcutaneous Tissue Skin/Subcutaneous Tissue N/A Level: 0.13 0.07 N/A Debridement A (sq cm): rea Curette Curette N/A Instrument: Minimum Minimum N/A Bleeding: Pressure Pressure N/A Hemostasis Achieved: Debridement Treatment Response: Procedure was tolerated well Procedure was tolerated well N/A Post Debridement Measurements L x 0.4x0.4x0.2 0.3x0.3x0.1 N/A W x D (cm) 0.025 0.007 N/A Post Debridement Volume: (cm) Category/Stage II Category/Stage II N/A Post Debridement Stage: No Abnormalities Noted No Abnormalities Noted N/A Periwound Skin Texture: No Abnormalities Noted No Abnormalities Noted N/A Periwound Skin Moisture: No Abnormalities Noted No Abnormalities Noted N/A Periwound Skin Color: No Abnormality No Abnormality N/A Temperature: Debridement Debridement N/A Procedures  Performed: Treatment Notes Electronic Signature(s) Signed: 12/04/2023 9:53:39 AM By: Marolyn Nest MD FACS Previous Signature: 12/04/2023 9:10:21 AM Version By: Marolyn Nest MD FACS Entered By: Marolyn Nest on 12/04/2023 09:53:39 Multi-Disciplinary Care Plan Details -------------------------------------------------------------------------------- [name] ([id number]) [id number] Page 5 of 9 Patient Name: Date of Service: Timothy [name], [name] 12/04/2023 9:00 A M Medical Record Number: [id number] Patient Account Number: [id number] Date of Birth/Sex: Treating RN: 21-Oct-1941 (83 y.o. [name] [name] Primary Care Latasha Puskas: Regino, [name] Other Clinician: Referring Vernita Tague: Treating Damon Baisch/Extender: Marolyn Nest Regino, [name] Weeks in Treatment: 0 Active Inactive Wound/Skin Impairment Nursing Diagnoses: Impaired tissue integrity Goals: Patient/caregiver will verbalize understanding of skin care regimen Date Initiated: 12/04/2023 Target Resolution Date: 01/25/2024 Goal Status: Active Interventions: Assess ulceration(s) every visit Treatment Activities: Skin care regimen initiated : 12/04/2023 Notes: Electronic Signature(s) Signed: 12/04/2023 12:32:21 PM By: [name] RN Entered By: [name] on 12/04/2023 09:59:12 -------------------------------------------------------------------------------- Pain Assessment Details Patient Name: Date of Service: Timothy MALVA RE, [name] 12/04/2023 9:00 A M Medical Record Number: [id number] Patient Account Number: [id number] Date of Birth/Sex: Treating RN: 1941/03/08 (83 y.o. [name] [name] Primary Care Seira Cody: Regino, [name] Other Clinician: Referring Jaydence Arnesen: Treating Aleesa Sweigert/Extender: Marolyn Nest Regino, [name] Weeks in Treatment: 0 Active Problems Location of Pain Severity and Description of Pain Patient Has Paino No Site Locations Pain Management and Medication Current Pain  Management: [name], [name] ([id number]) [id number] Page 6 of 9 Electronic Signature(s) Signed: 12/04/2023 12:32:21 PM By: [name] RN Entered By: [name] on 12/04/2023 09:21:04 -------------------------------------------------------------------------------- Patient/Caregiver Education Details Patient Name: Date of Service: Timothy MALVA RE, [name] 1/7/2025andnbsp9:00 A M Medical Record Number: [id number] Patient Account Number: [id number] Date of Birth/Gender: Treating RN: 15-Aug-1941 (83 y.o. [name] [name] Primary Care Physician: Regino, [name] Other Clinician: Referring Physician: Treating Physician/Extender: Marolyn Nest Regino, [name] Weeks in Treatment: 0 Education Assessment Education Provided To: Patient Education Topics Provided Wound/Skin Impairment: Methods: Explain/Verbal Responses: State content correctly Electronic Signature(s) Signed: 12/04/2023 12:32:21 PM By: [name] RN Entered By: [name] on 12/04/2023 09:59:24 -------------------------------------------------------------------------------- Wound Assessment Details Patient Name: Date of Service: Timothy [name], [name] 12/04/2023 9:00 A M Medical Record Number: [id number] Patient Account Number: [id number] Date of Birth/Sex: Treating RN: 03-Aug-1941 (83 y.o. [name] [name] Primary Care Laquasia Pincus: Regino, [name] Other Clinician: Referring Dayshia Ballinas: Treating Karra Pink/Extender: Marolyn Nest Regino, [name] Weeks in Treatment: 0 Wound Status Wound Number: 2 Primary Pressure Ulcer Etiology: Wound Location: Right Gluteus Wound Open Wounding Event: Gradually Appeared Status: Date Acquired: 03/27/2021 Comorbid Cataracts, Coronary Artery Disease, Hypertension, Myocardial Weeks Of Treatment: 0 History: Infarction, Type II Diabetes, Osteoarthritis Clustered Wound: No Photos [name], [name] ([id number]) [id number] Page 7 of 9 Wound  Measurements Length: (cm) 0.4 Width: (cm) 0.4 Depth: (cm) 0.2 Area: (cm)  0.126 Volume: (cm) 0.025 % Reduction in Area: % Reduction in Volume: Epithelialization: Small (1-33%) Tunneling: No Undermining: No Wound Description Classification: Category/Stage III Wound Margin: Distinct, outline attached Exudate Amount: Medium Exudate Type: Serosanguineous Exudate Color: red, brown Foul Odor After Cleansing: No Slough/Fibrino Yes Wound Bed Granulation Amount: Medium (34-66%) Exposed Structure Granulation Quality: Red Fascia Exposed: No Necrotic Amount: Medium (34-66%) Fat Layer (Subcutaneous Tissue) Exposed: Yes Necrotic Quality: Eschar, Adherent Slough Tendon Exposed: No Muscle Exposed: No Joint Exposed: No Bone Exposed: No Periwound Skin Texture Texture Color No Abnormalities Noted: Yes No Abnormalities Noted: Yes Moisture Temperature / Pain No Abnormalities Noted: Yes Temperature: No Abnormality Treatment Notes Wound #2 (Gluteus) Wound Laterality: Right Cleanser Vashe 5.8 (oz) Discharge Instruction: Cleanse the wound with Vashe prior to applying a clean dressing using gauze sponges, not tissue or cotton balls. Peri-Wound Care Topical Primary Dressing Maxorb Extra Calcium Alginate, 2x2 (in/in) Discharge Instruction: Apply to wound bed as instructed Secondary Dressing Zetuvit Plus Silicone Border Dressing 3x3 (in/in) Discharge Instruction: Apply silicone border over primary dressing as directed. Secured With Compression Wrap Compression Stockings Facilities Manager) Signed: 12/04/2023 5:26:52 PM By: [name] RN Previous Signature: 12/04/2023 12:32:21 PM Version By: [name] RN Entered By: [name] on 12/04/2023 16:06:49 [name] ([id number]) [id number] Page 8 of 9 -------------------------------------------------------------------------------- Wound Assessment Details Patient Name: Date of Service: Timothy MALVA  RE, [name] 12/04/2023 9:00 A M Medical Record Number: [id number] Patient Account Number: [id number] Date of Birth/Sex: Treating RN: 07-Aug-1941 (83 y.o. [name] [name] Primary Care Edwards Mckelvie: Regino, [name] Other Clinician: Referring Makalynn Berwanger: Treating Kamile Fassler/Extender: [name], [name] Weeks in Treatment: 0 Wound Status Wound Number: 3 Primary Pressure Ulcer Etiology: Wound Location: Left Gluteus Wound Open Wounding Event: Gradually Appeared Status: Date Acquired: 03/27/2021 Comorbid Cataracts, Coronary Artery Disease, Hypertension, Myocardial Weeks Of Treatment: 0 History: Infarction, Type II Diabetes, Osteoarthritis Clustered Wound: No Photos Wound Measurements Length: (cm) 0 Width: (cm) 0 Depth: (cm) 0 Area: (cm) Volume: (cm) .3 % Reduction in Area: .3 % Reduction in Volume: .1 Epithelialization: Small (1-33%) 0.071 Tunneling: No 0.007 Undermining: No Wound Description Classification: Category/Stage III Wound Margin: Distinct, outline attached Exudate Amount: Medium Exudate Type: Serosanguineous Exudate Color: red, brown Foul Odor After Cleansing: No Slough/Fibrino Yes Wound Bed Granulation Amount: Medium (34-66%) Exposed Structure Granulation Quality: Red Fascia Exposed: No Necrotic Amount: Medium (34-66%) Fat Layer (Subcutaneous Tissue) Exposed: Yes Necrotic Quality: Eschar, Adherent Slough Tendon Exposed: No Muscle Exposed: No Joint Exposed: No Bone Exposed: No Periwound Skin Texture Texture Color No Abnormalities Noted: Yes No Abnormalities Noted: Yes Moisture Temperature / Pain No Abnormalities Noted: Yes Temperature: No Abnormality Treatment Notes Wound #3 (Gluteus) Wound Laterality: Left Cleanser Vashe 5.8 (oz) [name], [name] ([id number]) [id number] Page 9 of 9 Discharge Instruction: Cleanse the wound with Vashe prior to applying a clean dressing using gauze sponges, not tissue or cotton balls. Peri-Wound  Care Topical Primary Dressing Maxorb Extra Calcium Alginate, 2x2 (in/in) Discharge Instruction: Apply to wound bed as instructed Secondary Dressing Zetuvit Plus Silicone Border Dressing 3x3 (in/in) Discharge Instruction: Apply silicone border over primary dressing as directed. Secured With Compression Wrap Compression Stockings Facilities Manager) Signed: 12/04/2023 5:26:52 PM By: [name] RN Previous Signature: 12/04/2023 12:32:21 PM Version By: [name] RN Entered By: [name] on 12/04/2023 16:07:06 -------------------------------------------------------------------------------- Vitals Details Patient Name: Date of Service: Timothy [name], [name] 12/04/2023 9:00 A M Medical Record Number: [id number] Patient Account Number: [id number] Date of Birth/Sex: Treating RN:  07/26/1941 (82 y.o. [name] [name] Primary Care Joesiah Lonon: Regino, [name] Other Clinician: Referring Ameirah Khatoon: Treating Dell Briner/Extender: [name], [name] Weeks in Treatment: 0 Vital Signs Time Taken: 09:06 Temperature (F): 97.5 Height (in): 70 Pulse (bpm): 61 Weight (lbs): 202 Respiratory Rate (breaths/min): 18 Body Mass Index (BMI): 29 Blood Pressure (mmHg): 149/79 Reference Range: 80 - 120 mg / dl Electronic Signature(s) Signed: 12/04/2023 12:32:21 PM By: [name] RN Entered By: [name] on 12/04/2023 09:06:36

## 2023-12-05 NOTE — Progress Notes (Signed)
 SOLON, ALBAN ([id number]) [phone] [id number] Page 1 of 4 Visit Report for 12/04/2023 Abuse Risk Screen Details Patient Name: Date of Service: [name], [place] X E. 12/04/2023 9:00 A M Medical Record Number: [id number] Patient Account Number: [id number] Date of Birth/Sex: Treating RN: 1941-09-14 (83 y.o. [name] Primary Care Makaylie Dedeaux: Regino, [name] Other Clinician: Referring Calaya Gildner: Treating Neely Kammerer/Extender: [name], [name] Weeks in Treatment: 0 Abuse Risk Screen Items Answer ABUSE RISK SCREEN: Has anyone close to you tried to hurt or harm you recentlyo No Do you feel uncomfortable with anyone in your familyo No Has anyone forced you do things that you didnt want to doo No Electronic Signature(s) Signed: 12/04/2023 12:32:21 PM By: [name] RN Entered By: [name] on 12/04/2023 09:09:38 -------------------------------------------------------------------------------- Activities of Daily Living Details Patient Name: Date of Service: Timothy MALVA RE, [name] E. 12/04/2023 9:00 A M Medical Record Number: [id number] Patient Account Number: [id number] Date of Birth/Sex: Treating RN: [birth year] (83 y.o. [name] Primary Care Noah Lembke: Regino, [name] Other Clinician: Referring Kallan Merrick: Treating Abbey Veith/Extender: [name], [name] Weeks in Treatment: 0 Activities of Daily Living Items Answer Activities of Daily Living (Please select one for each item) Drive Automobile Not Able T Medications ake Completely Able Use T elephone Not Able Care for Appearance Need Assistance Use T oilet Need Assistance Bath / Shower Need Assistance Dress Self Need Assistance Feed Self Completely Able Walk Need Assistance Get In / Out Bed Need Assistance Housework Need Assistance Prepare Meals Need Assistance Handle Money Need Assistance Shop for Self Need Assistance Electronic Signature(s) Signed: 12/04/2023 12:32:21 PM By:  [name] RN Entered By: [name] on 12/04/2023 09:10:33 GEORGINA ROYDEN BRAVO ([id number]) [id number] Page 2 of 4 -------------------------------------------------------------------------------- Education Screening Details Patient Name: Date of Service: [name], [place] X E. 12/04/2023 9:00 A M Medical Record Number: [id number] Patient Account Number: [id number] Date of Birth/Sex: Treating RN: 04-03-1941 (83 y.o. [name] Primary Care Timmy Cleverly: Regino, [name] Other Clinician: Referring Aitana Burry: Treating Barby Colvard/Extender: [name], [name] Weeks in Treatment: 0 Primary Learner Assessed: Patient Learning Preferences/Education Level/Primary Language Learning Preference: Explanation, Demonstration, Video, Printed Material Highest Education Level: College or Above Preferred Language: English Cognitive Barrier Language Barrier: No Translator Needed: No Memory Deficit: No Emotional Barrier: No Physical Barrier Impaired Vision: No Impaired Hearing: No Decreased Hand dexterity: Yes Limitations: left side weakness Knowledge/Comprehension Knowledge Level: High Comprehension Level: High Ability to understand written instructions: High Ability to understand verbal instructions: High Motivation Anxiety Level: Calm Cooperation: Cooperative Education Importance: Acknowledges Need Interest in Health Problems: Asks Questions Perception: Coherent Willingness to Engage in Self-Management High Activities: Readiness to Engage in Self-Management High Activities: Electronic Signature(s) Signed: 12/04/2023 12:32:21 PM By: [name] RN Signed: 12/04/2023 5:04:29 PM By: Merleen Handing RN, BSN Entered By: [name] on 12/04/2023 09:10:45 -------------------------------------------------------------------------------- Fall Risk Assessment Details Patient Name: Date of Service: Timothy [name], [name] E. 12/04/2023 9:00 A M Medical Record  Number: [id number] Patient Account Number: [id number] Date of Birth/Sex: Treating RN: [birth year] (83 y.o. [name] Primary Care Michaeljames Milnes: Regino, [name] Other Clinician: Referring Tillman Kazmierski: Treating Anthonie Lotito/Extender: [name], [name] Weeks in Treatment: 0 Fall Risk Assessment Items Have you had 2 or more falls in the last [address] [facility], [facility] E ([id number]) [phone] [id number] Page 3 of 4 Have you had any fall that resulted in injury in the last 12 monthso 0 Yes FALLS RISK SCREEN History of falling - immediate or within 3  months 0 No Secondary diagnosis (Do you have 2 or more medical diagnoseso) 0 No Ambulatory aid None/bed rest/wheelchair/nurse 0 No Crutches/cane/walker 0 No Furniture 0 No Intravenous therapy Access/Saline/Heparin Lock 0 No Gait/Transferring Normal/ bed rest/ wheelchair 0 Yes Weak (short steps with or without shuffle, stooped but able to lift head while walking, may seek 10 Yes support from furniture) Impaired (short steps with shuffle, may have difficulty arising from chair, head down, impaired 0 No balance) Mental Status Oriented to own ability 0 No Electronic Signature(s) Signed: 12/04/2023 12:32:21 PM By: [name] RN Entered By: [name] on 12/04/2023 09:11:32 -------------------------------------------------------------------------------- Foot Assessment Details Patient Name: Date of Service: Timothy [name], [name] E. 12/04/2023 9:00 A M Medical Record Number: [id number] Patient Account Number: [id number] Date of Birth/Sex: Treating RN: [birth year] (83 y.o. [name] Primary Care Cloa Bushong: Regino, [name] Other Clinician: Referring Tashona Calk: Treating Crystale Giannattasio/Extender: [name], [name] Weeks in Treatment: 0 Foot Assessment Items Site Locations + = Sensation present, - = Sensation absent, C = Callus, U = Ulcer R = Redness, W = Warmth, M = Maceration, PU = Pre-ulcerative  lesion F = Fissure, S = Swelling, D = Dryness Assessment Right: Left: Other Deformity: No No Prior Foot Ulcer: No No Prior Amputation: No No Charcot Joint: No No Ambulatory Status: Ambulatory With Help Assistance Device: ROYALTY, DOMAGALA ([id number]) [phone] [id number] Page 4 of 4 Gait: Steady Electronic Signature(s) Signed: 12/04/2023 12:32:21 PM By: [name] RN Entered By: [name] on 12/04/2023 09:12:11 -------------------------------------------------------------------------------- Nutrition Risk Screening Details Patient Name: Date of Service: Timothy MALVA RE, [name] E. 12/04/2023 9:00 A M Medical Record Number: [id number] Patient Account Number: [id number] Date of Birth/Sex: Treating RN: [birth year] (83 y.o. [name] Primary Care Malonie Tatum: Regino, [name] Other Clinician: Referring Lamond Glantz: Treating Beretta Ginsberg/Extender: [name], [name] Weeks in Treatment: 0 Height (in): 70 Weight (lbs): 202 Body Mass Index (BMI): 29 Nutrition Risk Screening Items Score Screening NUTRITION RISK SCREEN: I have an illness or condition that made me change the kind and/or amount of food I eat 0 No I eat fewer than two meals per day 3 Yes I eat few fruits and vegetables, or milk products 0 No I have three or more drinks of beer, liquor or wine almost every day 0 No I have tooth or mouth problems that make it hard for me to eat 0 No I don't always have enough money to buy the food I need 0 No I eat alone most of the time 0 No I take three or more different prescribed or over-the-counter drugs a day 1 Yes Without wanting to, I have lost or gained 10 pounds in the last six months 0 No I am not always physically able to shop, cook and/or feed myself 0 No Nutrition Protocols Good Risk Protocol 0 No interventions needed Moderate Risk Protocol High Risk Proctocol Risk Level: Moderate Risk Score: 4 Electronic Signature(s) Signed: 12/04/2023  12:32:21 PM By: [name] RN Entered By: [name] on 12/04/2023 09:11:55

## 2023-12-05 NOTE — Progress Notes (Addendum)
 [name] ([id number]) [phone].pdf Page 1 of 10 Visit Report for 12/04/2023 Chief Complaint Document Details Patient Name: Date of Service: [name], [name] 12/04/2023 9:00 A M Medical Record Number: [id number] Patient Account Number: [id number] Date of Birth/Sex: Treating RN: 1941-08-11 (83 y.o. M) Primary Care Provider: Regino, [name] Other Clinician: Referring Provider: Treating Provider/Extender: [name] [name] Weeks in Treatment: 0 Information Obtained from: Patient Chief Complaint 10/04/2021; patient is here for a superficial pressure ulcer on his right buttock 07/06/2022; patient presents for superficial pressure ulcers on his right and left buttocks 12/04/2023: returns with bilateral stage 3 pressure ulcers on buttocks Electronic Signature(s) Signed: 12/04/2023 9:58:28 AM By: Timothy Delon MD FACS Previous Signature: 12/04/2023 9:53:52 AM Version By: Timothy Delon MD FACS Entered By: [name] on 12/04/2023 06:58:27 -------------------------------------------------------------------------------- Debridement Details Patient Name: Date of Service: [name], [name]. 12/04/2023 9:00 A M Medical Record Number: [id number] Patient Account Number: [id number] Date of Birth/Sex: Treating RN: 1941-08-25 (83 y.o. [name] Primary Care Provider: Regino, [name] Other Clinician: Referring Provider: Treating Provider/Extender: [name] [name] Weeks in Treatment: 0 Debridement Performed for Assessment: Wound #3 Gluteus Performed By: Physician Timothy Delon, MD The following information was scribed by: [name] The information was scribed for: [name] Debridement Type: Debridement Level of Consciousness (Pre-procedure): Awake and Alert Pre-procedure Verification/Time Out Yes - 09:35 Taken: Start Time: 09:35 Pain Control: Lidocaine  4% T opical Solution Percent of Wound Bed Debrided: 100% T Area  Debrided (cm): otal 0.07 Tissue and other material debrided: Viable, Non-Viable, Slough, Subcutaneous, Slough Level: Skin/Subcutaneous Tissue Debridement Description: Excisional Instrument: Curette Bleeding: Minimum Hemostasis Achieved: Pressure Response to Treatment: Procedure was tolerated well Level of Consciousness (Post- Awake and Alert procedure): Post Debridement Measurements of Total Wound Length: (cm) 0.3 Stage: Category/Stage II Width: (cm) 0.3 Depth: (cm) 0.1 Volume: (cm) 0.007 [name], [name] ([id number]) [id number] Page 2 of 10 Character of Wound/Ulcer Post Debridement: Improved Post Procedure Diagnosis Same as Pre-procedure Electronic Signature(s) Signed: 12/04/2023 12:28:17 PM By: Timothy Delon MD FACS Signed: 12/04/2023 12:32:21 PM By: Timothy Pollen RN Entered By: [name] on 12/04/2023 06:40:14 -------------------------------------------------------------------------------- Debridement Details Patient Name: Date of Service: [name], [name]. 12/04/2023 9:00 A M Medical Record Number: [id number] Patient Account Number: [id number] Date of Birth/Sex: Treating RN: 1941/05/10 (83 y.o. [name] Primary Care Provider: Regino, [name] Other Clinician: Referring Provider: Treating Provider/Extender: [name] [name] Weeks in Treatment: 0 Debridement Performed for Assessment: Wound #2 Right Gluteus Performed By: Physician Timothy Delon, MD The following information was scribed by: [name] The information was scribed for: [name] Debridement Type: Debridement Level of Consciousness (Pre-procedure): Awake and Alert Pre-procedure Verification/Time Out Yes - 09:35 Taken: Start Time: 09:35 Pain Control: Lidocaine  4% T opical Solution Percent of Wound Bed Debrided: 100% T Area Debrided (cm): otal 0.13 Tissue and other material debrided: Viable, Non-Viable, Eschar, Slough, Subcutaneous,  Slough Level: Skin/Subcutaneous Tissue Debridement Description: Excisional Instrument: Curette Bleeding: Minimum Hemostasis Achieved: Pressure Response to Treatment: Procedure was tolerated well Level of Consciousness (Post- Awake and Alert procedure): Post Debridement Measurements of Total Wound Length: (cm) 0.4 Stage: Category/Stage II Width: (cm) 0.4 Depth: (cm) 0.2 Volume: (cm) 0.025 Character of Wound/Ulcer Post Debridement: Improved Post Procedure Diagnosis Same as Pre-procedure Electronic Signature(s) Signed: 12/04/2023 12:28:17 PM By: Timothy Delon MD FACS Signed: 12/04/2023 12:32:21 PM By: Timothy Pollen RN Entered By: [name] on 12/04/2023 06:42:12 -------------------------------------------------------------------------------- HPI Details Patient Name: Date of Service:  [name], [name]. 12/04/2023 9:00 A [name], [name] ([id number]) [phone].pdf Page 3 of 10 Medical Record Number: [id number] Patient Account Number: [id number] Date of Birth/Sex: Treating RN: [birth year] (83 y.o. M) Primary Care Provider: Regino, [name] Other Clinician: Referring Provider: Treating Provider/Extender: [name] [name] Weeks in Treatment: 0 History of Present Illness HPI Description: ADMISSION 10/04/2021 This is an 83 year old man who has a history of a recurrent wound on his right buttock. He is cared for at home by family members. He is referred here by [facility] physicians. Both areas and mirror image surrounding the mid part of the gluteal cleft have dry scaling nonblanchable skin cyst highly suggestive of excess pressure. He had a eschared area on the right buttock in this area I used a #3 curette to remove this there is still an open wound here which is a stage II pressure ulcer. 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 Uses a walker otherwise he will fall. He sits on a recliner for most  of the day but sleeps in a bed. Recently she has been using Triderma cream on the erythematous areas which she got on [employer] and she thinks that will help the skin in this area more than anything Past medical history includes basal cell CA, seborrheic dermatitis, type 2 diabetes, abdominal aortic aneurysm and TIA 07/06/2022 Patient presents for recurrence of wounds to his left and right buttocks. He states that these wax and wane in healing. He sits for long periods of time watching his favorite TV show, Gunsmoke. He has to ambulate with a cane and often uses a wheel chair. He only gets up to go to the bathroom. He does not have a cushion for his wheelchair. He tries to use a gel cushion for his lift chair. he has been using terasil with benefit. He denies signs of infection. Patient states he would like to follow-up as needed and is present today to See if there is anything further to do. READMISSION 12/04/2023 Apparently the wounds that were present in August 2023 never actually completely healed and the patient returns to clinic today for further evaluation and management. The patient's wife has been applying T errasil, but more recently, their PCP prescribed Santyl. He continues to spend much of his day in a recliner. He says that he sits on a pillow and gets up frequently to urinate. He claims that he tries to shift his weight frequently but the tissue evidence suggests he is not doing an adequate job. Also, his most recent hemoglobin A1c was 8.3, a significant increase from the previous 6.9. Apparently the patient's PCP is not making any medication adjustments this point but wants him to try and improve his diet first. Electronic Signature(s) Signed: 12/04/2023 9:56:19 AM By: Timothy Delon MD FACS Entered By: [name] on 12/04/2023 06:56:19 -------------------------------------------------------------------------------- Physical Exam Details Patient Name: Date of Service: Timothy [name], Timothy  X [name]. 12/04/2023 9:00 A M Medical Record Number: [id number] Patient Account Number: [id number] Date of Birth/Sex: Treating RN: Jun 27, 1941 (83 y.o. M) Primary Care Provider: Regino, [name] Other Clinician: Referring Provider: Treating Provider/Extender: [name] [name] Weeks in Treatment: 0 Constitutional Slightly hypertensive. . . . No acute distress. Respiratory Normal work of breathing on room air. Notes 12/04/2023: on the point of each buttock, there is a small circular ulcer with a little bit of slough and eschar accumulation. The surrounding skin is purpleish and consistent with fairly constant  pressure on the site. Electronic Signature(s) Signed: 12/04/2023 9:57:30 AM By: Timothy Nest MD FACS Entered By: Timothy Nest on 12/04/2023 06:57:30 Harvel, [name] ([id number]) [id number] Page 4 of 10 -------------------------------------------------------------------------------- Physician Orders Details Patient Name: Date of Service: [name], [name] 12/04/2023 9:00 A M Medical Record Number: [id number] Patient Account Number: [id number] Date of Birth/Sex: Treating RN: 1941/09/15 (83 y.o. [name] Primary Care Provider: Regino, [name] Other Clinician: Referring Provider: Treating Provider/Extender: Timothy Nest [name] Weeks in Treatment: 0 Verbal / Phone Orders: No Diagnosis Coding ICD-10 Coding Code Description L89.313 Pressure ulcer of right buttock, stage 3 L89.323 Pressure ulcer of left buttock, stage 3 E11.622 Type 2 diabetes mellitus with other skin ulcer R26.89 Other abnormalities of gait and mobility Follow-up Appointments ppointment in 2 weeks. - Dr. Marolyn Return A Anesthetic (In clinic) Topical Lidocaine  4% applied to wound bed Bathing/ Shower/ Hygiene May shower and wash wound with soap and water. Off-Loading Roho cushion for wheelchair - Museum/gallery Conservator for Wheelchair Wound Treatment Wound #2 - Gluteus  Wound Laterality: Right Cleanser: Vashe 5.8 (oz) 1 x Per Day/30 Days Discharge Instructions: Cleanse the wound with Vashe prior to applying a clean dressing using gauze sponges, not tissue or cotton balls. Prim Dressing: Maxorb Extra Calcium Alginate, 2x2 (in/in) 1 x Per Day/30 Days ary Discharge Instructions: Apply to wound bed as instructed Secondary Dressing: Zetuvit Plus Silicone Border Dressing 3x3 (in/in) 1 x Per Day/30 Days Discharge Instructions: Apply silicone border over primary dressing as directed. Wound #3 - Gluteus Wound Laterality: Left Cleanser: Vashe 5.8 (oz) 1 x Per Day/30 Days Discharge Instructions: Cleanse the wound with Vashe prior to applying a clean dressing using gauze sponges, not tissue or cotton balls. Prim Dressing: Maxorb Extra Calcium Alginate, 2x2 (in/in) 1 x Per Day/30 Days ary Discharge Instructions: Apply to wound bed as instructed Secondary Dressing: Zetuvit Plus Silicone Border Dressing 3x3 (in/in) 1 x Per Day/30 Days Discharge Instructions: Apply silicone border over primary dressing as directed. Devices Museum/gallery Conservator (Wheelchair) - ROHO Cushion for offloading while patient is in the Wheelchair - (ICD10 [id number] - Pressure ulcer of right buttock, stage 3) Electronic Signature(s) Signed: 12/04/2023 12:28:17 PM By: Timothy Nest MD FACS Entered By: Timothy Nest on 12/04/2023 06:59:32 Prescription 12/04/2023 [name] ([id number]) [id number] Page 5 of 10 -------------------------------------------------------------------------------- [name], Kharee [name]. Timothy Nest MD Patient Name: Provider: 1941/07/10 [id number] Date of Birth: NPI#: CHRISTELLA [id number] Sex: DEA #: [phone] Phone #: License #: UPN: Patient Address: [address] [place] [facility] [place], [place] [address] [address] [place], [place] [address] [phone] Allergies No Known Allergies Provider's  Orders Roho Cushion (Wheelchair) - ICD10: L89.313 - ROHO Cushion for offloading while patient is in the Wheelchair Hand Signature: Date(s): Electronic Signature(s) Signed: 12/04/2023 10:05:59 AM By: Timothy Nest MD FACS Entered By: Timothy Nest on 12/04/2023 07:05:59 -------------------------------------------------------------------------------- Problem List Details Patient Name: Date of Service: [name], [name]. 12/04/2023 9:00 A M Medical Record Number: [id number] Patient Account Number: [id number] Date of Birth/Sex: Treating RN: Jun 30, 1941 (83 y.o. M) Primary Care Provider: Regino, [name] Other Clinician: Referring Provider: Treating Provider/Extender: Timothy Nest [name] Weeks in Treatment: 0 Active Problems ICD-10 Encounter Code Description Active Date MDM Diagnosis L89.313 Pressure ulcer of right buttock, stage 3 12/04/2023 No Yes L89.323 Pressure ulcer of left buttock, stage 3 12/04/2023 No Yes E11.622 Type 2 diabetes mellitus with other skin ulcer 12/04/2023 No Yes R26.89 Other abnormalities  of gait and mobility 12/04/2023 No Yes Inactive Problems Resolved Problems Electronic Signature(s) Signed: 12/04/2023 9:57:54 AM By: Timothy Nest MD FACS Previous Signature: 12/04/2023 9:53:22 AM Version By: Timothy Nest MD FACS Previous Signature: 12/04/2023 9:10:15 AM Version By: Timothy Nest MD FACS Previous Signature: 12/04/2023 9:02:23 AM Version By: Timothy Nest MD FACS [place], [name] ([id number]) [id number] Page 6 of 10 Previous Signature: 12/04/2023 9:02:23 AM Version By: Timothy Nest MD FACS Previous Signature: 12/04/2023 9:01:22 AM Version By: Timothy Nest MD FACS Entered By: Timothy Nest on 12/04/2023 06:57:54 -------------------------------------------------------------------------------- Progress Note Details Patient Name: Date of Service: [name], [name]. 12/04/2023 9:00 A M Medical Record Number: [id number] Patient  Account Number: [id number] Date of Birth/Sex: Treating RN: [birth year] (83 y.o. M) Primary Care Provider: Regino, [name] Other Clinician: Referring Provider: Treating Provider/Extender: Timothy Nest [name] Weeks in Treatment: 0 Subjective Chief Complaint Information obtained from Patient 10/04/2021; patient is here for a superficial pressure ulcer on his right buttock 07/06/2022; patient presents for superficial pressure ulcers on his right and left buttocks 12/04/2023: returns with bilateral stage 3 pressure ulcers on buttocks History of Present Illness (HPI) ADMISSION 10/04/2021 This is an 83 year old man who has a history of a recurrent wound on his right buttock. He is cared for at home by family members. He is referred here by [facility] physicians. Both areas and mirror image surrounding the mid part of the gluteal cleft have dry scaling nonblanchable skin cyst highly suggestive of excess pressure. He had a eschared area on the right buttock in this area I used a #3 curette to remove this there is still an open wound here which is a stage II pressure ulcer. 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 Uses a walker otherwise he will fall. He sits on a recliner for most of the day but sleeps in a bed. Recently she has been using Triderma cream on the erythematous areas which she got on [employer] and she thinks that will help the skin in this area more than anything Past medical history includes basal cell CA, seborrheic dermatitis, type 2 diabetes, abdominal aortic aneurysm and TIA 07/06/2022 Patient presents for recurrence of wounds to his left and right buttocks. He states that these wax and wane in healing. He sits for long periods of time watching his favorite TV show, Gunsmoke. He has to ambulate with a cane and often uses a wheel chair. He only gets up to go to the bathroom. He does not have a cushion for his wheelchair. He tries to  use a gel cushion for his lift chair. he has been using terasil with benefit. He denies signs of infection. Patient states he would like to follow-up as needed and is present today to See if there is anything further to do. READMISSION 12/04/2023 Apparently the wounds that were present in August 2023 never actually completely healed and the patient returns to clinic today for further evaluation and management. The patient's wife has been applying T errasil, but more recently, their PCP prescribed Santyl. He continues to spend much of his day in a recliner. He says that he sits on a pillow and gets up frequently to urinate. He claims that he tries to shift his weight frequently but the tissue evidence suggests he is not doing an adequate job. Also, his most recent hemoglobin A1c was 8.3, a significant increase from the previous 6.9. Apparently the patient's PCP is not making any medication adjustments  this point but wants him to try and improve his diet first. Patient History Information obtained from Patient. Allergies No Known Allergies Family History Cancer - Siblings, Hypertension - Siblings, No family history of Diabetes, Heart Disease, Hereditary Spherocytosis, Kidney Disease, Lung Disease, Seizures, Stroke, Thyroid  Problems, Tuberculosis. Social History Former smoker - ended on 11/27/1973, Marital Status - Married, Alcohol Use - Never, Drug Use - No History, Caffeine Use - Daily. Medical History Eyes Patient has history of Cataracts - had surgery Cardiovascular Patient has history of Coronary Artery Disease, Hypertension, Myocardial Infarction Endocrine Patient has history of Type II Diabetes Musculoskeletal Patient has history of Osteoarthritis Medical A Surgical History Notes nd Cardiovascular AAA, hyperlipidemia Neurologic neuromuscular disorder, TIA Oncologic basal cell Cancer Right Arm [name], [name] ([id number]) [phone].pdf Page 7 of  10 Objective Constitutional Slightly hypertensive. No acute distress. Vitals Time Taken: 9:06 AM, Height: 70 in, Weight: 202 lbs, BMI: 29, Temperature: 97.5 F, Pulse: 61 bpm, Respiratory Rate: 18 breaths/min, Blood Pressure: 149/79 mmHg. Respiratory Normal work of breathing on room air. General Notes: 12/04/2023: on the point of each buttock, there is a small circular ulcer with a little bit of slough and eschar accumulation. The surrounding skin is purpleish and consistent with fairly constant pressure on the site. Integumentary (Hair, Skin) Wound #2 status is Open. Original cause of wound was Gradually Appeared. The date acquired was: 03/27/2021. The wound is located on the Right Gluteus. The wound measures 0.4cm length x 0.4cm width x 0.2cm depth; 0.126cm^2 area and 0.025cm^3 volume. There is Fat Layer (Subcutaneous Tissue) exposed. There is no tunneling or undermining noted. There is a medium amount of serosanguineous drainage noted. The wound margin is distinct with the outline attached to the wound base. There is medium (34-66%) red granulation within the wound bed. There is a medium (34-66%) amount of necrotic tissue within the wound bed including Eschar and Adherent Slough. The periwound skin appearance had no abnormalities noted for texture. The periwound skin appearance had no abnormalities noted for moisture. The periwound skin appearance had no abnormalities noted for color. Periwound temperature was noted as No Abnormality. Wound #3 status is Open. Original cause of wound was Gradually Appeared. The date acquired was: 03/27/2021. The wound is located on the Left Gluteus. The wound measures 0.3cm length x 0.3cm width x 0.1cm depth; 0.071cm^2 area and 0.007cm^3 volume. There is Fat Layer (Subcutaneous Tissue) exposed. There is no tunneling or undermining noted. There is a medium amount of serosanguineous drainage noted. The wound margin is distinct with the outline attached to the wound  base. There is medium (34-66%) red granulation within the wound bed. There is a medium (34-66%) amount of necrotic tissue within the wound bed including Eschar and Adherent Slough. The periwound skin appearance had no abnormalities noted for texture. The periwound skin appearance had no abnormalities noted for moisture. The periwound skin appearance had no abnormalities noted for color. Periwound temperature was noted as No Abnormality. Assessment Active Problems ICD-10 Pressure ulcer of right buttock, stage 3 Pressure ulcer of left buttock, stage 3 Type 2 diabetes mellitus with other skin ulcer Other abnormalities of gait and mobility Procedures Wound #2 Pre-procedure diagnosis of Wound #2 is a Pressure Ulcer located on the Right Gluteus . There was a Excisional Skin/Subcutaneous Tissue Debridement with a total area of 0.13 sq cm performed by Timothy Nest, MD. With the following instrument(s): Curette to remove Viable and Non-Viable tissue/material. Material removed includes Eschar, Subcutaneous Tissue, and Slough after achieving pain control using  Lidocaine  4% T opical Solution. No specimens were taken. A time out was conducted at 09:35, prior to the start of the procedure. A Minimum amount of bleeding was controlled with Pressure. The procedure was tolerated well. Post Debridement Measurements: 0.4cm length x 0.4cm width x 0.2cm depth; 0.025cm^3 volume. Post debridement Stage noted as Category/Stage II. Character of Wound/Ulcer Post Debridement is improved. Post procedure Diagnosis Wound #2: Same as Pre-Procedure Wound #3 Pre-procedure diagnosis of Wound #3 is a Pressure Ulcer located on the Gluteus . There was a Excisional Skin/Subcutaneous Tissue Debridement with a total area of 0.07 sq cm performed by Timothy Nest, MD. With the following instrument(s): Curette to remove Viable and Non-Viable tissue/material. Material removed includes Subcutaneous Tissue and Slough and after  achieving pain control using Lidocaine  4% T opical Solution. No specimens were taken. A time out was conducted at 09:35, prior to the start of the procedure. A Minimum amount of bleeding was controlled with Pressure. The procedure was tolerated well. Post Debridement Measurements: 0.3cm length x 0.3cm width x 0.1cm depth; 0.007cm^3 volume. Post debridement Stage noted as Category/Stage II. Character of Wound/Ulcer Post Debridement is improved. Post procedure Diagnosis Wound #3: Same as Pre-Procedure Plan [name], [name] ([id number]) [phone].pdf Page 8 of 10 Follow-up Appointments: Return Appointment in 2 weeks. - Dr. Marolyn Anesthetic: (In clinic) Topical Lidocaine  4% applied to wound bed Bathing/ Shower/ Hygiene: May shower and wash wound with soap and water. Off-Loading: Roho cushion for wheelchair - Museum/gallery Conservator for [employer] ordered were: Customer Service Manager) - Museum/gallery Conservator for offloading while patient is in the Wheelchair WOUND #2: - Gluteus Wound Laterality: Right Cleanser: Vashe 5.8 (oz) 1 x Per Day/30 Days Discharge Instructions: Cleanse the wound with Vashe prior to applying a clean dressing using gauze sponges, not tissue or cotton balls. Prim Dressing: Maxorb Extra Calcium Alginate, 2x2 (in/in) 1 x Per Day/30 Days ary Discharge Instructions: Apply to wound bed as instructed Secondary Dressing: Zetuvit Plus Silicone Border Dressing 3x3 (in/in) 1 x Per Day/30 Days Discharge Instructions: Apply silicone border over primary dressing as directed. WOUND #3: - Gluteus Wound Laterality: Left Cleanser: Vashe 5.8 (oz) 1 x Per Day/30 Days Discharge Instructions: Cleanse the wound with Vashe prior to applying a clean dressing using gauze sponges, not tissue or cotton balls. Prim Dressing: Maxorb Extra Calcium Alginate, 2x2 (in/in) 1 x Per Day/30 Days ary Discharge Instructions: Apply to wound bed as instructed Secondary Dressing: Zetuvit Plus  Silicone Border Dressing 3x3 (in/in) 1 x Per Day/30 Days Discharge Instructions: Apply silicone border over primary dressing as directed. 12/04/2023: This patient returns today with persistent bilateral pressure ulcers on his buttocks. I do not think he is adequately offloading while he is sitting in his chair. I have suggested a Roho cushion to try and do a better job of keeping the pressure off of his buttocks. T oday, I debrided eschar, slough, and subcutaneous tissue from each ulcer. We will apply silver alginate and foam border dressings to each location. He apparently sleeps on a regular mattress but does sleep on his side.. Once his wounds are healed, I have recommended to his wife that she apply Desitin as a barrier cream. I would like them to follow-up in 2 weeks so that we can assess the progress. Electronic Signature(s) Signed: 12/05/2023 5:28:22 PM By: Drury Nestle RN, BSN Signed: 12/06/2023 7:57:50 AM By: Timothy Nest MD FACS Previous Signature: 12/04/2023 10:05:08 AM Version By: Timothy Nest MD FACS Entered By: Drury Nestle on 12/05/2023 14:27:12 --------------------------------------------------------------------------------  HxROS Details Patient Name: Date of Service: [name], [name] 12/04/2023 9:00 A M Medical Record Number: [id number] Patient Account Number: [id number] Date of Birth/Sex: Treating RN: December 07, 1940 (83 y.o. Timothy Merleen Handing Primary Care Provider: Regino, [name] Other Clinician: Referring Provider: Treating Provider/Extender: [name] [name] Weeks in Treatment: 0 Information Obtained From Patient Eyes Medical History: Positive for: Cataracts - had surgery Cardiovascular Medical History: Positive for: Coronary Artery Disease; Hypertension; Myocardial Infarction Past Medical History Notes: AAA, hyperlipidemia Endocrine Medical History: Positive for: Type II Diabetes Time with diabetes: 20 years Treated with: Oral agents Blood sugar  tested every day: No [name], [name] ([id number]) [id number] Page 9 of 10 Musculoskeletal Medical History: Positive for: Osteoarthritis Neurologic Medical History: Past Medical History Notes: neuromuscular disorder, TIA Oncologic Medical History: Past Medical History Notes: basal cell Cancer Right Arm HBO Extended History Items Eyes: Cataracts Immunizations Pneumococcal Vaccine: Received Pneumococcal Vaccination: Yes Received Pneumococcal Vaccination On or After 60th Birthday: Yes Implantable Devices None Family and Social History Cancer: Yes - Siblings; Diabetes: No; Heart Disease: No; Hereditary Spherocytosis: No; Hypertension: Yes - Siblings; Kidney Disease: No; Lung Disease: No; Seizures: No; Stroke: No; Thyroid  Problems: No; Tuberculosis: No; Former smoker - ended on 11/27/1973; Marital Status - Married; Alcohol Use: Never; Drug Use: No History; Caffeine Use: Daily Social Determinants of Health (SDOH) 1. In the past 2 months, did you or others you live with eat smaller meals or skip meals because you didn't have money for foodo : No 2. Are you homeless or worried that you might be in the futureo : No 3. Do you have trouble paying for your utilities (gas, electricity, phone)o : No 4. Do you have trouble finding or paying for a rideo : No 5. Do you need daycare, or better daycare, for your kidso : No 6. Are you unemployed or without regular incomeo : No 7. Do you need help finding a better jobo : No 8. Do you need help getting more educationo : No 9. Are you concerned about someone in your home using drugs or alcoholo : No 10. Do you feel unsafe in your daily lifeo : No 11. Is anyone in your home threatening or abusing youo : No 12. Do you lack quality relationships that make you feel valued and supportedo : No 13. Do you need help getting cultural information in a language you understando : No 14. Do you need help getting internet accesso :  No Advanced Directives and Instructions Spiritual or Cultural beliefs preclude asking about Advance Care Planning: No Advanced Directives: No Patient wants information on Advanced Directives: No Do not resuscitate: No Living Will: No Medical Power of Attorney: No Surrogate Decision Maker: No Electronic Signature(s) Signed: 12/04/2023 12:28:17 PM By: Timothy Delon MD FACS Signed: 12/04/2023 12:32:21 PM By: Timothy Pollen RN Signed: 12/04/2023 5:04:29 PM By: Merleen Handing RN, BSN Entered By: [name] on 12/04/2023 06:09:31 -------------------------------------------------------------------------------- SuperBill Details Patient Name: Date of Service: [name], [name]. 12/04/2023 Medical Record Number: [id number] Patient Account Number: [id number] [name], [name] ([id number]) [phone].pdf Page 10 of 10 Date of Birth/Sex: Treating RN: September 29, 1941 (83 y.o. M) Primary Care Provider: Regino, [name] Other Clinician: Referring Provider: Treating Provider/Extender: [name] [name] Weeks in Treatment: 0 Diagnosis Coding ICD-10 Codes Code Description L89.313 Pressure ulcer of right buttock, stage 3 L89.323 Pressure ulcer of left buttock, stage 3 E11.622 Type 2 diabetes mellitus with other skin ulcer R26.89 Other abnormalities of gait and mobility Facility Procedures :  CPT4 Code: 23899860 Description: 99214 - WOUND CARE VISIT-LEV 4 EST PT Modifier: Quantity: 1 : CPT4 Code: 63899987 Description: 11042 - DEB SUBQ TISSUE 20 SQ CM/< ICD-10 Diagnosis Description L89.313 Pressure ulcer of right buttock, stage 3 L89.323 Pressure ulcer of left buttock, stage 3 Modifier: Quantity: 1 Physician Procedures : CPT4 Code Description Modifier [id number] 99214 - WC PHYS LEVEL 4 - EST PT 25 ICD-10 Diagnosis Description L89.313 Pressure ulcer of right buttock, stage 3 L89.323 Pressure ulcer of left buttock, stage 3 E11.622 Type 2 diabetes mellitus with other skin   ulcer R26.89 Other abnormalities of gait and mobility Quantity: 1 : 3229831 11042 - WC PHYS SUBQ TISS 20 SQ CM ICD-10 Diagnosis Description L89.313 Pressure ulcer of right buttock, stage 3 L89.323 Pressure ulcer of left buttock, stage 3 Quantity: 1 Electronic Signature(s) Signed: 12/04/2023 12:28:17 PM By: Timothy Nest MD FACS Signed: 12/04/2023 12:32:21 PM By: Timothy Pollen RN Previous Signature: 12/04/2023 10:05:52 AM Version By: Timothy Nest MD FACS Entered By: [name] on 12/04/2023 07:09:00

## 2023-12-10 ENCOUNTER — Other Ambulatory Visit: Payer: Medicare Other

## 2023-12-19 ENCOUNTER — Encounter (HOSPITAL_BASED_OUTPATIENT_CLINIC_OR_DEPARTMENT_OTHER): Payer: Medicare Other | Admitting: General Surgery

## 2023-12-19 DIAGNOSIS — E119 Type 2 diabetes mellitus without complications: Secondary | ICD-10-CM | POA: Diagnosis not present

## 2023-12-19 DIAGNOSIS — L89313 Pressure ulcer of right buttock, stage 3: Secondary | ICD-10-CM | POA: Diagnosis not present

## 2023-12-19 DIAGNOSIS — I714 Abdominal aortic aneurysm, without rupture, unspecified: Secondary | ICD-10-CM | POA: Diagnosis not present

## 2023-12-19 DIAGNOSIS — Z8673 Personal history of transient ischemic attack (TIA), and cerebral infarction without residual deficits: Secondary | ICD-10-CM | POA: Diagnosis not present

## 2023-12-19 DIAGNOSIS — R2689 Other abnormalities of gait and mobility: Secondary | ICD-10-CM | POA: Diagnosis not present

## 2023-12-19 DIAGNOSIS — Z85828 Personal history of other malignant neoplasm of skin: Secondary | ICD-10-CM | POA: Diagnosis not present

## 2023-12-19 DIAGNOSIS — L89323 Pressure ulcer of left buttock, stage 3: Secondary | ICD-10-CM | POA: Diagnosis not present

## 2024-01-09 ENCOUNTER — Ambulatory Visit (HOSPITAL_BASED_OUTPATIENT_CLINIC_OR_DEPARTMENT_OTHER): Payer: Medicare Other | Admitting: General Surgery

## 2024-02-18 DIAGNOSIS — R2689 Other abnormalities of gait and mobility: Secondary | ICD-10-CM | POA: Diagnosis not present

## 2024-02-18 DIAGNOSIS — L89313 Pressure ulcer of right buttock, stage 3: Secondary | ICD-10-CM | POA: Diagnosis not present

## 2024-02-18 DIAGNOSIS — L89323 Pressure ulcer of left buttock, stage 3: Secondary | ICD-10-CM | POA: Diagnosis not present

## 2024-02-18 DIAGNOSIS — E11622 Type 2 diabetes mellitus with other skin ulcer: Secondary | ICD-10-CM | POA: Diagnosis not present

## 2024-03-10 ENCOUNTER — Other Ambulatory Visit: Payer: Medicare Other

## 2024-03-11 DIAGNOSIS — E1165 Type 2 diabetes mellitus with hyperglycemia: Secondary | ICD-10-CM | POA: Diagnosis not present

## 2024-03-11 DIAGNOSIS — N1832 Chronic kidney disease, stage 3b: Secondary | ICD-10-CM | POA: Diagnosis not present

## 2024-03-18 DIAGNOSIS — H1031 Unspecified acute conjunctivitis, right eye: Secondary | ICD-10-CM | POA: Diagnosis not present

## 2024-03-20 DIAGNOSIS — L89313 Pressure ulcer of right buttock, stage 3: Secondary | ICD-10-CM | POA: Diagnosis not present

## 2024-03-20 DIAGNOSIS — E11622 Type 2 diabetes mellitus with other skin ulcer: Secondary | ICD-10-CM | POA: Diagnosis not present

## 2024-03-20 DIAGNOSIS — L89323 Pressure ulcer of left buttock, stage 3: Secondary | ICD-10-CM | POA: Diagnosis not present

## 2024-04-19 DIAGNOSIS — E11622 Type 2 diabetes mellitus with other skin ulcer: Secondary | ICD-10-CM | POA: Diagnosis not present

## 2024-04-19 DIAGNOSIS — L89313 Pressure ulcer of right buttock, stage 3: Secondary | ICD-10-CM | POA: Diagnosis not present

## 2024-04-19 DIAGNOSIS — L89323 Pressure ulcer of left buttock, stage 3: Secondary | ICD-10-CM | POA: Diagnosis not present

## 2024-05-20 DIAGNOSIS — L89313 Pressure ulcer of right buttock, stage 3: Secondary | ICD-10-CM | POA: Diagnosis not present

## 2024-05-20 DIAGNOSIS — E11622 Type 2 diabetes mellitus with other skin ulcer: Secondary | ICD-10-CM | POA: Diagnosis not present

## 2024-05-20 DIAGNOSIS — L89323 Pressure ulcer of left buttock, stage 3: Secondary | ICD-10-CM | POA: Diagnosis not present

## 2024-06-17 ENCOUNTER — Ambulatory Visit
Admission: RE | Admit: 2024-06-17 | Discharge: 2024-06-17 | Disposition: A | Discharge status: Home / Self Care | Source: Ambulatory Visit | Attending: Family Medicine | Admitting: Family Medicine

## 2024-06-17 DIAGNOSIS — I10 Essential (primary) hypertension: Secondary | ICD-10-CM | POA: Diagnosis not present

## 2024-06-17 DIAGNOSIS — I7143 Infrarenal abdominal aortic aneurysm, without rupture: Secondary | ICD-10-CM | POA: Diagnosis not present

## 2024-06-17 DIAGNOSIS — E785 Hyperlipidemia, unspecified: Secondary | ICD-10-CM | POA: Diagnosis not present

## 2024-06-19 DIAGNOSIS — R2689 Other abnormalities of gait and mobility: Secondary | ICD-10-CM | POA: Diagnosis not present

## 2024-06-19 DIAGNOSIS — L89313 Pressure ulcer of right buttock, stage 3: Secondary | ICD-10-CM | POA: Diagnosis not present

## 2024-06-27 DIAGNOSIS — I7143 Infrarenal abdominal aortic aneurysm, without rupture: Secondary | ICD-10-CM | POA: Diagnosis not present

## 2024-06-27 DIAGNOSIS — E1140 Type 2 diabetes mellitus with diabetic neuropathy, unspecified: Secondary | ICD-10-CM | POA: Diagnosis not present

## 2024-06-27 DIAGNOSIS — I10 Essential (primary) hypertension: Secondary | ICD-10-CM | POA: Diagnosis not present

## 2024-06-27 DIAGNOSIS — E1165 Type 2 diabetes mellitus with hyperglycemia: Secondary | ICD-10-CM | POA: Diagnosis not present

## 2024-06-27 DIAGNOSIS — N1832 Chronic kidney disease, stage 3b: Secondary | ICD-10-CM | POA: Diagnosis not present

## 2024-06-27 DIAGNOSIS — Z79899 Other long term (current) drug therapy: Secondary | ICD-10-CM | POA: Diagnosis not present

## 2024-06-27 DIAGNOSIS — E7800 Pure hypercholesterolemia, unspecified: Secondary | ICD-10-CM | POA: Diagnosis not present

## 2024-06-27 DIAGNOSIS — R239 Unspecified skin changes: Secondary | ICD-10-CM | POA: Diagnosis not present

## 2024-07-20 DIAGNOSIS — L89313 Pressure ulcer of right buttock, stage 3: Secondary | ICD-10-CM | POA: Diagnosis not present

## 2024-07-20 DIAGNOSIS — R2689 Other abnormalities of gait and mobility: Secondary | ICD-10-CM | POA: Diagnosis not present

## 2024-08-20 DIAGNOSIS — R2689 Other abnormalities of gait and mobility: Secondary | ICD-10-CM | POA: Diagnosis not present

## 2024-08-20 DIAGNOSIS — L89313 Pressure ulcer of right buttock, stage 3: Secondary | ICD-10-CM | POA: Diagnosis not present

## 2024-09-03 DIAGNOSIS — H52223 Regular astigmatism, bilateral: Secondary | ICD-10-CM | POA: Diagnosis not present

## 2024-09-19 DIAGNOSIS — R2689 Other abnormalities of gait and mobility: Secondary | ICD-10-CM | POA: Diagnosis not present

## 2024-09-19 DIAGNOSIS — L89313 Pressure ulcer of right buttock, stage 3: Secondary | ICD-10-CM | POA: Diagnosis not present

## 2024-09-25 DIAGNOSIS — L578 Other skin changes due to chronic exposure to nonionizing radiation: Secondary | ICD-10-CM | POA: Diagnosis not present

## 2024-09-25 DIAGNOSIS — Z85828 Personal history of other malignant neoplasm of skin: Secondary | ICD-10-CM | POA: Diagnosis not present

## 2024-09-25 DIAGNOSIS — D485 Neoplasm of uncertain behavior of skin: Secondary | ICD-10-CM | POA: Diagnosis not present

## 2024-09-25 DIAGNOSIS — Z08 Encounter for follow-up examination after completed treatment for malignant neoplasm: Secondary | ICD-10-CM | POA: Diagnosis not present
